# Patient Record
Sex: Male | Born: 1935
Health system: Southern US, Community
[De-identification: ages and names within clinical notes are randomized; demographics above are authoritative.]

## PROBLEM LIST (undated history)

## (undated) DIAGNOSIS — C801 Malignant (primary) neoplasm, unspecified: Secondary | ICD-10-CM

## (undated) DIAGNOSIS — M19011 Primary osteoarthritis, right shoulder: Secondary | ICD-10-CM

## (undated) DIAGNOSIS — I519 Heart disease, unspecified: Secondary | ICD-10-CM

## (undated) DIAGNOSIS — I10 Essential (primary) hypertension: Secondary | ICD-10-CM

## (undated) DIAGNOSIS — Z8546 Personal history of malignant neoplasm of prostate: Secondary | ICD-10-CM

## (undated) DIAGNOSIS — I2510 Atherosclerotic heart disease of native coronary artery without angina pectoris: Secondary | ICD-10-CM

## (undated) DIAGNOSIS — T4145XA Adverse effect of unspecified anesthetic, initial encounter: Secondary | ICD-10-CM

## (undated) DIAGNOSIS — T8859XA Other complications of anesthesia, initial encounter: Secondary | ICD-10-CM

## (undated) DIAGNOSIS — E785 Hyperlipidemia, unspecified: Secondary | ICD-10-CM

## (undated) DIAGNOSIS — Z973 Presence of spectacles and contact lenses: Secondary | ICD-10-CM

## (undated) DIAGNOSIS — M1990 Unspecified osteoarthritis, unspecified site: Secondary | ICD-10-CM

## (undated) HISTORY — PX: TONSILLECTOMY: SUR1361

## (undated) HISTORY — PX: CATARACT EXTRACTION, BILATERAL: SHX1313

## (undated) HISTORY — DX: Heart disease, unspecified: I51.9

## (undated) HISTORY — DX: Essential (primary) hypertension: I10

## (undated) HISTORY — DX: Hyperlipidemia, unspecified: E78.5

## (undated) HISTORY — DX: Personal history of malignant neoplasm of prostate: Z85.46

## (undated) HISTORY — PX: COLONOSCOPY: SHX174

## (undated) HISTORY — PX: CARDIAC CATHETERIZATION: SHX172

## (undated) HISTORY — PX: INSERTION PROSTATE RADIATION SEED: SUR718

---

## 1998-09-27 ENCOUNTER — Encounter (INDEPENDENT_AMBULATORY_CARE_PROVIDER_SITE_OTHER): Payer: Self-pay

## 1998-09-27 ENCOUNTER — Ambulatory Visit (HOSPITAL_COMMUNITY): Admission: RE | Admit: 1998-09-27 | Discharge: 1998-09-27 | Payer: Self-pay | Admitting: Gastroenterology

## 2001-03-21 ENCOUNTER — Ambulatory Visit: Admission: RE | Admit: 2001-03-21 | Discharge: 2001-06-19 | Payer: Self-pay | Admitting: Radiation Oncology

## 2001-03-24 ENCOUNTER — Encounter: Payer: Self-pay | Admitting: Urology

## 2001-03-24 ENCOUNTER — Ambulatory Visit (HOSPITAL_COMMUNITY): Admission: RE | Admit: 2001-03-24 | Discharge: 2001-03-24 | Payer: Self-pay | Admitting: Urology

## 2001-03-25 ENCOUNTER — Ambulatory Visit (HOSPITAL_COMMUNITY): Admission: RE | Admit: 2001-03-25 | Discharge: 2001-03-25 | Payer: Self-pay | Admitting: Urology

## 2001-03-25 ENCOUNTER — Encounter: Payer: Self-pay | Admitting: Urology

## 2001-06-20 ENCOUNTER — Ambulatory Visit: Admission: RE | Admit: 2001-06-20 | Discharge: 2001-09-18 | Payer: Self-pay | Admitting: Radiation Oncology

## 2001-07-18 ENCOUNTER — Encounter: Payer: Self-pay | Admitting: Urology

## 2001-07-18 ENCOUNTER — Encounter: Admission: RE | Admit: 2001-07-18 | Discharge: 2001-07-18 | Payer: Self-pay | Admitting: Urology

## 2001-07-18 ENCOUNTER — Ambulatory Visit (HOSPITAL_COMMUNITY): Admission: RE | Admit: 2001-07-18 | Discharge: 2001-07-18 | Payer: Self-pay | Admitting: Urology

## 2001-08-12 ENCOUNTER — Ambulatory Visit (HOSPITAL_COMMUNITY): Admission: RE | Admit: 2001-08-12 | Discharge: 2001-08-12 | Payer: Self-pay | Admitting: Urology

## 2001-08-12 ENCOUNTER — Encounter: Payer: Self-pay | Admitting: Urology

## 2005-04-26 ENCOUNTER — Encounter: Admission: RE | Admit: 2005-04-26 | Discharge: 2005-04-26 | Payer: Self-pay | Admitting: Internal Medicine

## 2005-05-01 ENCOUNTER — Encounter: Admission: RE | Admit: 2005-05-01 | Discharge: 2005-05-01 | Payer: Self-pay | Admitting: Internal Medicine

## 2008-10-13 ENCOUNTER — Inpatient Hospital Stay (HOSPITAL_COMMUNITY): Admission: EM | Admit: 2008-10-13 | Discharge: 2008-10-14 | Payer: Self-pay | Admitting: Emergency Medicine

## 2008-10-14 ENCOUNTER — Encounter (INDEPENDENT_AMBULATORY_CARE_PROVIDER_SITE_OTHER): Payer: Self-pay | Admitting: *Deleted

## 2008-10-14 ENCOUNTER — Ambulatory Visit: Payer: Self-pay | Admitting: Vascular Surgery

## 2008-11-01 ENCOUNTER — Encounter: Admission: RE | Admit: 2008-11-01 | Discharge: 2008-11-01 | Payer: Self-pay | Admitting: Internal Medicine

## 2009-06-05 DIAGNOSIS — Z8546 Personal history of malignant neoplasm of prostate: Secondary | ICD-10-CM

## 2009-06-05 HISTORY — PX: CORONARY STENT PLACEMENT: SHX1402

## 2009-06-05 HISTORY — DX: Personal history of malignant neoplasm of prostate: Z85.46

## 2009-06-10 ENCOUNTER — Inpatient Hospital Stay (HOSPITAL_COMMUNITY)
Admission: RE | Admit: 2009-06-10 | Discharge: 2009-06-11 | Payer: Self-pay | Source: Home / Self Care | Admitting: Cardiology

## 2009-06-23 ENCOUNTER — Encounter (HOSPITAL_COMMUNITY): Admission: RE | Admit: 2009-06-23 | Discharge: 2009-09-21 | Payer: Self-pay | Admitting: Cardiology

## 2009-07-01 ENCOUNTER — Emergency Department (HOSPITAL_COMMUNITY): Admission: EM | Admit: 2009-07-01 | Discharge: 2009-07-01 | Payer: Self-pay | Admitting: Emergency Medicine

## 2009-08-01 DIAGNOSIS — K4090 Unilateral inguinal hernia, without obstruction or gangrene, not specified as recurrent: Secondary | ICD-10-CM | POA: Insufficient documentation

## 2009-09-22 ENCOUNTER — Encounter (HOSPITAL_COMMUNITY): Admission: RE | Admit: 2009-09-22 | Discharge: 2009-11-04 | Payer: Self-pay | Admitting: Cardiology

## 2010-04-24 LAB — URINE CULTURE: Culture: NO GROWTH

## 2010-04-24 LAB — POCT I-STAT, CHEM 8
BUN: 22 mg/dL (ref 6–23)
Chloride: 109 mEq/L (ref 96–112)
Creatinine, Ser: 1 mg/dL (ref 0.4–1.5)
Glucose, Bld: 97 mg/dL (ref 70–99)
Potassium: 3.9 mEq/L (ref 3.5–5.1)
Sodium: 139 mEq/L (ref 135–145)
TCO2: 23 mmol/L (ref 0–100)

## 2010-04-24 LAB — CBC
HCT: 46.2 % (ref 39.0–52.0)
Hemoglobin: 16.3 g/dL (ref 13.0–17.0)
RDW: 13.3 % (ref 11.5–15.5)

## 2010-04-24 LAB — PROTIME-INR: INR: 1.1 (ref 0.00–1.49)

## 2010-04-24 LAB — DIFFERENTIAL
Basophils Absolute: 0 10*3/uL (ref 0.0–0.1)
Eosinophils Absolute: 0.1 10*3/uL (ref 0.0–0.7)
Eosinophils Relative: 2 % (ref 0–5)
Lymphocytes Relative: 20 % (ref 12–46)
Monocytes Absolute: 0.6 10*3/uL (ref 0.1–1.0)
Neutrophils Relative %: 68 % (ref 43–77)

## 2010-04-24 LAB — URINALYSIS, ROUTINE W REFLEX MICROSCOPIC
Ketones, ur: NEGATIVE mg/dL
Specific Gravity, Urine: 1.025 (ref 1.005–1.030)
Urobilinogen, UA: 1 mg/dL (ref 0.0–1.0)
pH: 5.5 (ref 5.0–8.0)

## 2010-04-24 LAB — POCT CARDIAC MARKERS
CKMB, poc: 2 ng/mL (ref 1.0–8.0)
Troponin i, poc: <0.05 ng/mL (ref 0.00–0.09)

## 2010-04-25 LAB — LIPID PANEL
Total CHOL/HDL Ratio: 3.1 RATIO
Triglycerides: 103 mg/dL (ref <150)
VLDL: 21 mg/dL (ref 0–40)

## 2010-04-25 LAB — BASIC METABOLIC PANEL
BUN: 13 mg/dL (ref 6–23)
CO2: 24 mEq/L (ref 19–32)
Calcium: 8.9 mg/dL (ref 8.4–10.5)
Chloride: 109 mEq/L (ref 96–112)
Creatinine, Ser: 0.85 mg/dL (ref 0.4–1.5)
GFR calc non Af Amer: >60 mL/min (ref >60)
Glucose, Bld: 91 mg/dL (ref 70–99)
Sodium: 140 mEq/L (ref 135–145)

## 2010-04-25 LAB — CBC
HCT: 41.7 % (ref 39.0–52.0)
RBC: 4.67 MIL/uL (ref 4.22–5.81)
RDW: 13.5 % (ref 11.5–15.5)
WBC: 5.8 10*3/uL (ref 4.0–10.5)

## 2010-05-12 LAB — COMPREHENSIVE METABOLIC PANEL
AST: 29 U/L (ref 0–37)
Albumin: 4.3 g/dL (ref 3.5–5.2)
BUN: 13 mg/dL (ref 6–23)
BUN: 20 mg/dL (ref 6–23)
CO2: 22 mEq/L (ref 19–32)
CO2: 24 mEq/L (ref 19–32)
Calcium: 8.5 mg/dL (ref 8.4–10.5)
Calcium: 9.9 mg/dL (ref 8.4–10.5)
Chloride: 108 mEq/L (ref 96–112)
Creatinine, Ser: 0.9 mg/dL (ref 0.4–1.5)
Creatinine, Ser: 1.32 mg/dL (ref 0.4–1.5)
GFR calc Af Amer: >60 mL/min (ref >60)
GFR calc non Af Amer: 53 mL/min — ABNORMAL LOW (ref >60)
GFR calc non Af Amer: >60 mL/min (ref >60)
Glucose, Bld: 120 mg/dL — ABNORMAL HIGH (ref 70–99)
Glucose, Bld: 125 mg/dL — ABNORMAL HIGH (ref 70–99)
Sodium: 143 mEq/L (ref 135–145)
Total Protein: 5.3 g/dL — ABNORMAL LOW (ref 6.0–8.3)

## 2010-05-12 LAB — DIFFERENTIAL
Lymphs Abs: 0.6 10*3/uL — ABNORMAL LOW (ref 0.7–4.0)
Neutro Abs: 13.6 10*3/uL — ABNORMAL HIGH (ref 1.7–7.7)

## 2010-05-12 LAB — CARDIAC PANEL(CRET KIN+CKTOT+MB+TROPI)
CK, MB: 9.8 ng/mL — ABNORMAL HIGH (ref 0.3–4.0)
Relative Index: INVALID (ref 0.0–2.5)
Relative Index: INVALID (ref 0.0–2.5)
Total CK: 71 U/L (ref 7–232)

## 2010-05-12 LAB — LIPID PANEL
Cholesterol: 146 mg/dL (ref 0–200)
HDL: 50 mg/dL (ref >39)
LDL Cholesterol: 76 mg/dL (ref 0–99)
Triglycerides: 100 mg/dL (ref <150)

## 2010-05-12 LAB — URINALYSIS, ROUTINE W REFLEX MICROSCOPIC
Glucose, UA: NEGATIVE mg/dL
Hgb urine dipstick: NEGATIVE
Ketones, ur: 15 mg/dL — AB
Specific Gravity, Urine: 1.026 (ref 1.005–1.030)

## 2010-05-12 LAB — CULTURE, BLOOD (ROUTINE X 2)
Culture: NO GROWTH
Culture: NO GROWTH

## 2010-05-12 LAB — URINE CULTURE: Colony Count: 7000

## 2010-05-12 LAB — CK TOTAL AND CKMB (NOT AT ARMC): Total CK: 181 U/L (ref 7–232)

## 2010-05-12 LAB — POCT CARDIAC MARKERS: Myoglobin, poc: >500 ng/mL (ref 12–200)

## 2010-05-12 LAB — URINE MICROSCOPIC-ADD ON

## 2010-05-12 LAB — TSH: TSH: 0.614 u[IU]/mL (ref 0.350–4.500)

## 2010-05-12 LAB — CBC
HCT: 50.7 % (ref 39.0–52.0)
Hemoglobin: 14.8 g/dL (ref 13.0–17.0)
Hemoglobin: 17.6 g/dL — ABNORMAL HIGH (ref 13.0–17.0)
MCHC: 34.7 g/dL (ref 30.0–36.0)
MCV: 88.9 fL (ref 78.0–100.0)
Platelets: 178 10*3/uL (ref 150–400)
RBC: 4.8 MIL/uL (ref 4.22–5.81)
RDW: 14 % (ref 11.5–15.5)

## 2010-05-12 LAB — PROTIME-INR: INR: 1.1 (ref 0.00–1.49)

## 2010-05-12 LAB — TROPONIN I: Troponin I: 0.01 ng/mL (ref 0.00–0.06)

## 2010-06-23 NOTE — Op Note (Signed)
[facility]  Patient:    Gary Armstrong, Gary Armstrong Visit Number: [id number] MRN: [id number]          Service Type: DSU Location: DAY Attending Physician:  Ellwood Handler Dictated by:   Verl Dicker, M.D. Proc. Date: 08/12/01 Admit Date:  08/12/2001 Discharge Date: 08/12/2001   CC:         Theressa Millard, M.D.  Wynn Banker, M.D.   Operative Report  DATE OF BIRTH:  06/22/1929  LMD:  Theressa Millard, M.D.  RADIATION ONCOLOGIST:  Wynn Banker, M.D.  UROLOGIST:  Verl Dicker, M.D.  PREOPERATIVE DIAGNOSIS:  Prostate cancer.  POSTOPERATIVE DIAGNOSIS:  Prostate cancer.  PROCEDURE:  Transperineal placement of radioactive palladium 103 seeds.  DRAINS:  24 French Foley.  ANESTHESIA:  General.  DESCRIPTION OF PROCEDURE:  The patient was prepped and draped in the dorsal lithotomy position after institution of an adequate level of general anesthesia. A suprapubic fluoroscopy unit and transrectal ultrasound unit were placed as well as a rectal tube. The indwelling Foley catheter was then placed. A transverse and longitudinal scan of the prostate confirmed those obtained during the planning phase. Anchor needles were placed at C3.0, d3.0 and then a total of 75 seeds of palladium 103 were placed according to prearranged coordinates. A total of 18 needles were placed. Adjustment was made on needle #12 from d2.5 to E2.5, all other needles were placed according to prearranged coordinates and appeared to be in good position by ultrasound and fluoroscopy criteria. The fluoroscopy unit was then withdrawn as was the transrectal ultrasound and the indwelling Foley. Fiberoptic cystoscopy showed a normal urethra and sphincter, nonobstructive prostate, clear efflux right and left ureteral orifice, no evidence of seeds within the bladder. An 47 French Foley was then inserted and left to straight drain and the patient was returned to  recovery in satisfactory condition. Dictated by:   Verl Dicker, M.D. Attending Physician:  Ellwood Handler DD:  08/12/01 TD:  08/15/01 Job: [id number]

## 2010-10-31 ENCOUNTER — Encounter (INDEPENDENT_AMBULATORY_CARE_PROVIDER_SITE_OTHER): Payer: Self-pay | Admitting: Surgery

## 2010-11-02 ENCOUNTER — Encounter (INDEPENDENT_AMBULATORY_CARE_PROVIDER_SITE_OTHER): Payer: Self-pay | Admitting: Surgery

## 2010-11-02 ENCOUNTER — Encounter (INDEPENDENT_AMBULATORY_CARE_PROVIDER_SITE_OTHER): Payer: Self-pay | Admitting: General Surgery

## 2010-11-02 ENCOUNTER — Ambulatory Visit (INDEPENDENT_AMBULATORY_CARE_PROVIDER_SITE_OTHER): Payer: Medicare Other | Admitting: Surgery

## 2010-11-02 DIAGNOSIS — K4090 Unilateral inguinal hernia, without obstruction or gangrene, not specified as recurrent: Secondary | ICD-10-CM

## 2010-11-02 NOTE — Patient Instructions (Signed)
We will make arrangements for your hernia repair shortly after Thanksgiving by Dr Johna Sheriff.  We will need a note from your cardiolgist that you are ok for surgery and we will need to see you in the office about a week or two before the surgery.

## 2010-11-02 NOTE — Progress Notes (Signed)
  CC: Inguinal hernia HPI: I saw this patient last year with a right inguinal hernia. This was shortly after he had had a cardiac stent placed. He was asymptomatic and his cardiologist wanted [place] to wait about a year before doing surgery if possible. He comes in now to discuss possible repair. He has not developed any more symptoms and he had when I saw him last year.    PE GENERAL:  The patient is alert, oriented, and generally healthy-appearing, NAD. Mood and affect are normal.  HEENT:  The head is normocephalic, the eyes nonicteric, the pupils were round regular and equal. EOMs are normal. Pharynx normal. Dentition good.  NECK:  The neck is supple and there are no masses or thyromegaly.  LUNGS:  Normal respirations and clear to auscultation.  HEART:  Regular rhythm, with no murmurs rubs or gallops. Pulses are intact carotid dorsalis pedis and posterior tibial. No significant varicosities are noted.  ABDOMEN:  Soft, flat, and nontender. No masses or organomegaly is noted. No hernias are noted. Bowel sounds are normal.  GU:  Reducible Right inguinal hernia extending down towards the scrotum  EXTREMITIES:  Good range of motion, no edema.   Data Reviewed I have reviewed the notes from his primary care office.  Assessment Moderate sized reducible right inguinal hernia probably indirect  Plan I think it is appropriate to go ahead and schedule him for surgery. He would like to be done right after Thanksgiving. He would like to be done laparoscopically and I told him that I would arrange for one of my partners he does laparoscopic surgery to do the surgery. I told him we would need to have a cardiac clearance preoperatively in the yard he has a cardiology appointment scheduled in two weeks. He would also need to be seen in the office preoperatively for further discussions about the surgery.

## 2011-01-04 ENCOUNTER — Ambulatory Visit (INDEPENDENT_AMBULATORY_CARE_PROVIDER_SITE_OTHER): Payer: Medicare Other | Admitting: General Surgery

## 2011-01-04 ENCOUNTER — Encounter (INDEPENDENT_AMBULATORY_CARE_PROVIDER_SITE_OTHER): Payer: Self-pay | Admitting: General Surgery

## 2011-01-04 VITALS — BP 146/92 | HR 74 | Temp 96.4°F | Resp 16 | Ht 69.0 in | Wt 163.6 lb

## 2011-01-04 DIAGNOSIS — K4090 Unilateral inguinal hernia, without obstruction or gangrene, not specified as recurrent: Secondary | ICD-10-CM | POA: Insufficient documentation

## 2011-01-04 NOTE — Progress Notes (Signed)
Subjective:   Right inguinal hernia  Patient ID: Gary Armstrong, male   DOB: April 11, 1929, 77 y.o.   MRN: [id number]  HPI Patient returns for a preop evaluation prior to planned right inguinal hernia repair. He has had a gradually enlarging bulge in his right groin for at least a couple of years. Repair was temporarily delayed after cardiac stenting. It is gradually getting bigger. He occasionally gets some crampy mid abdominal pain and hernia gets very hard and then it improves after he lies down. He has had a colonoscopy within the last couple of years. No nausea or vomiting. He's not had any previous abdominal or hernia surgery. He notices no symptoms on the left.  Past Medical History  Diagnosis Date  . Hypertension   . Heart disease   . Hyperlipidemia   . History of prostate cancer 06/2009   Past Surgical History  Procedure Date  . Insertion prostate radiation seed   . Coronary stent placement 06/2009   Current Outpatient Prescriptions  Medication Sig Dispense Refill  . Ascorbic Acid (VITAMIN C) 1000 MG tablet Take 1,000 mg by mouth daily.        Marland Kitchen aspirin 325 MG tablet Take 325 mg by mouth daily.        . cholecalciferol (VITAMIN D) 400 UNITS TABS Take by mouth.        . fish oil-omega-3 fatty acids 1000 MG capsule Take 2 g by mouth daily.        Marland Kitchen glucosamine-chondroitin 500-400 MG tablet Take 1 tablet by mouth 3 (three) times daily.        . lansoprazole (PREVACID) 15 MG capsule Take 15 mg by mouth daily.        Marland Kitchen lisinopril (PRINIVIL,ZESTRIL) 5 MG tablet Take 5 mg by mouth daily.        Marland Kitchen lycopene 6 MG capsule Take 6 mg by mouth daily.        . nitroGLYCERIN (NITROSTAT) 0.4 MG SL tablet Place 0.4 mg under the tongue every 5 (five) minutes as needed.        . Potassium 99 MG TABS Take 99 mg by mouth daily.        . prasugrel (EFFIENT) 10 MG TABS Take 10 mg by mouth daily.        . simvastatin (ZOCOR) 40 MG tablet Take 40 mg by mouth at bedtime.        . Tamsulosin HCl (FLOMAX) 0.4  MG CAPS Take 0.4 mg by mouth daily.        . vitamin E 400 UNIT capsule Take 400 Units by mouth daily.        Marland Kitchen zinc gluconate 50 MG tablet Take 50 mg by mouth daily.         No Known Allergies   Review of Systems  Constitutional: Negative for fever, diaphoresis and appetite change.  HENT: Negative.   Respiratory: Negative for cough and shortness of breath.   Cardiovascular: Negative for chest pain.  Gastrointestinal: Positive for abdominal pain. Negative for diarrhea and constipation.       Objective:   Physical Exam General: Alert, well-developed thin white male, in no distress Skin: Warm and dry without rash or infection. HEENT: No palpable masses or thyromegaly. Sclera nonicteric. Pupils equal round and reactive. Oropharynx clear. Lymph nodes: No cervical, supraclavicular, or inguinal nodes palpable. Lungs: Breath sounds clear and equal without increased work of breathing Cardiovascular: Regular rate and rhythm without murmur. No JVD or edema. Peripheral pulses  intact. Abdomen: Nondistended. Soft and nontender. No masses palpable. No organomegaly. There is a good sized right inguinal hernia extending down into the upper scrotum that is completely reducible with the patient lying flat. No hernia palpable on the left.. Extremities: No edema or joint swelling or deformity. No chronic venous stasis changes. Neurologic: Alert and fully oriented. Gait normal.    Assessment:     Enlarging symptomatic right inguinal hernia. This certainly needs to be repaired. We discussed surgical options in detail including open and laparoscopic repair and he prefers laparoscopic repair so he can return to physical activity more quickly. We discussed the nature of the procedure in detail, expected recovery, and risks of general anesthesia with respiratory or cardiac problems, bleeding, infection, recurrence, chronic pain syndromes, and rare risk of bowel or bladder injury. He was given complete literature  regarding the procedure and his wife's questions were answered. This is scheduled for later next week.    Plan:     Laparoscopic repair of right inguinal hernia under general anesthesia as an outpatient

## 2011-01-04 NOTE — Patient Instructions (Signed)
Call for questions or concerns prior to her surgery

## 2011-01-10 ENCOUNTER — Telehealth (INDEPENDENT_AMBULATORY_CARE_PROVIDER_SITE_OTHER): Payer: Self-pay | Admitting: General Surgery

## 2011-01-10 DIAGNOSIS — K4090 Unilateral inguinal hernia, without obstruction or gangrene, not specified as recurrent: Secondary | ICD-10-CM

## 2011-01-10 HISTORY — PX: HERNIA REPAIR: SHX51

## 2011-01-25 ENCOUNTER — Encounter (INDEPENDENT_AMBULATORY_CARE_PROVIDER_SITE_OTHER): Payer: Self-pay | Admitting: General Surgery

## 2011-01-25 ENCOUNTER — Ambulatory Visit (INDEPENDENT_AMBULATORY_CARE_PROVIDER_SITE_OTHER): Payer: Medicare Other | Admitting: General Surgery

## 2011-01-25 VITALS — BP 152/96 | HR 82 | Temp 97.5°F | Ht 68.0 in | Wt 160.6 lb

## 2011-01-25 DIAGNOSIS — Z9889 Other specified postprocedural states: Secondary | ICD-10-CM

## 2011-01-25 NOTE — Progress Notes (Signed)
Patient returns following his laparoscopic repair of large right inguinal hernia. He reports he has got a long extremely well, denying any pain or difficulty postoperatively. He is walking regularly.  On examination the repair feels solid and his wounds are well healed.  He is done very well following laparoscopic hernia repair. We discussed return to normal physical activity and exercise. He will return here as needed.

## 2011-02-09 ENCOUNTER — Encounter (INDEPENDENT_AMBULATORY_CARE_PROVIDER_SITE_OTHER): Payer: Medicare Other | Admitting: General Surgery

## 2011-03-19 ENCOUNTER — Encounter (HOSPITAL_COMMUNITY): Payer: Self-pay

## 2011-03-19 ENCOUNTER — Other Ambulatory Visit: Payer: Self-pay

## 2011-03-19 ENCOUNTER — Emergency Department (HOSPITAL_COMMUNITY)
Admission: EM | Admit: 2011-03-19 | Discharge: 2011-03-19 | Disposition: A | Discharge status: Home / Self Care | Payer: Medicare Other | Attending: Emergency Medicine | Admitting: Emergency Medicine

## 2011-03-19 ENCOUNTER — Emergency Department (HOSPITAL_COMMUNITY): Payer: Medicare Other

## 2011-03-19 DIAGNOSIS — R059 Cough, unspecified: Secondary | ICD-10-CM | POA: Insufficient documentation

## 2011-03-19 DIAGNOSIS — R55 Syncope and collapse: Secondary | ICD-10-CM

## 2011-03-19 DIAGNOSIS — R42 Dizziness and giddiness: Secondary | ICD-10-CM | POA: Diagnosis not present

## 2011-03-19 DIAGNOSIS — I517 Cardiomegaly: Secondary | ICD-10-CM | POA: Diagnosis not present

## 2011-03-19 DIAGNOSIS — I10 Essential (primary) hypertension: Secondary | ICD-10-CM | POA: Diagnosis not present

## 2011-03-19 DIAGNOSIS — J40 Bronchitis, not specified as acute or chronic: Secondary | ICD-10-CM | POA: Diagnosis not present

## 2011-03-19 DIAGNOSIS — R404 Transient alteration of awareness: Secondary | ICD-10-CM | POA: Diagnosis not present

## 2011-03-19 DIAGNOSIS — E785 Hyperlipidemia, unspecified: Secondary | ICD-10-CM | POA: Diagnosis not present

## 2011-03-19 DIAGNOSIS — J209 Acute bronchitis, unspecified: Secondary | ICD-10-CM | POA: Diagnosis not present

## 2011-03-19 DIAGNOSIS — R05 Cough: Secondary | ICD-10-CM | POA: Insufficient documentation

## 2011-03-19 DIAGNOSIS — R61 Generalized hyperhidrosis: Secondary | ICD-10-CM | POA: Diagnosis not present

## 2011-03-19 LAB — DIFFERENTIAL
Basophils Absolute: 0 10*3/uL (ref 0.0–0.1)
Basophils Relative: 0 % (ref 0–1)
Eosinophils Absolute: 0.1 10*3/uL (ref 0.0–0.7)
Eosinophils Relative: 2 % (ref 0–5)
Monocytes Absolute: 0.5 10*3/uL (ref 0.1–1.0)

## 2011-03-19 LAB — CBC
HCT: 41 % (ref 39.0–52.0)
MCH: 30.7 pg (ref 26.0–34.0)
MCHC: 36.3 g/dL — ABNORMAL HIGH (ref 30.0–36.0)
MCV: 84.4 fL (ref 78.0–100.0)
RDW: 13.1 % (ref 11.5–15.5)

## 2011-03-19 LAB — BASIC METABOLIC PANEL
CO2: 25 mEq/L (ref 19–32)
Calcium: 9.3 mg/dL (ref 8.4–10.5)
Creatinine, Ser: 0.86 mg/dL (ref 0.50–1.35)
GFR calc Af Amer: >90 mL/min (ref >90)
GFR calc non Af Amer: 80 mL/min — ABNORMAL LOW (ref >90)

## 2011-03-19 LAB — TROPONIN I: Troponin I: <0.3 ng/mL (ref <0.30)

## 2011-03-19 MED ORDER — ALBUTEROL SULFATE HFA 108 (90 BASE) MCG/ACT IN AERS: 2.0000 | INHALATION_SPRAY | RESPIRATORY_TRACT | Status: DC | PRN

## 2011-03-19 MED ORDER — ACETAMINOPHEN-CODEINE 120-12 MG/5ML PO SOLN: 10.0000 mL | Freq: Four times a day (QID) | ORAL | Status: AC | PRN

## 2011-03-19 MED ORDER — ALBUTEROL SULFATE (5 MG/ML) 0.5% IN NEBU
2.5000 mg | INHALATION_SOLUTION | Freq: Once | RESPIRATORY_TRACT | Status: AC
  Administered 2011-03-19: 2.5 mg via RESPIRATORY_TRACT
  Filled 2011-03-19 (×2): qty 0.5

## 2011-03-19 NOTE — ED Notes (Signed)
Pt undressed and in a gown. Cardiac monitor, blood pressure cuff, and pulse ox on. Ekg done.

## 2011-03-19 NOTE — ED Notes (Signed)
Pt to ED via [facility], pt stood up this am, shaving, began coughing become light headed set down on bench seat and slid into floor, per spouse pt had brief period of LOC, pt reports not feeling well x2 days, 20 g RN, 12 lead NSR bp on arrival 90/60 just PTA 123/75, pt received 100 cc NS en route

## 2011-03-19 NOTE — ED Provider Notes (Signed)
History     CSN: [id number]  Arrival date & time 03/19/11  0810   First MD Initiated Contact with Patient 03/19/11 [phone]      Chief Complaint  Patient presents with  . Loss of Consciousness    (Consider location/radiation/quality/duration/timing/severity/associated sxs/prior treatment) Patient is a 78 y.o. male presenting with syncope. The history is provided by the patient and the spouse.  Loss of Consciousness  He is had a nonproductive cough for the last 3 days and has been taking Tussionex for back. 2 shave and started to feel lightheaded. He sat down at which point his wife noticed that he was looking like he was going to pass out and he then did indeed pass out. Loss of consciousness was rather brief, estimated at less than 1 minute. There was no chest pain, heaviness, tightness, pressure. He has not been having any dyspnea. He has not been having any fever or chills. He did not have any nausea or diaphoresis. He has had similar episodes in the past when he has had respiratory illnesses. For about 10 minutes before this episode occurred. He was not actively coughing when this episode occurred.  Past Medical History  Diagnosis Date  . Hypertension   . Heart disease   . Hyperlipidemia   . History of prostate cancer 06/2009    Past Surgical History  Procedure Date  . Insertion prostate radiation seed   . Coronary stent placement 06/2009  . Hernia repair 01/10/11    rih    Family History  Problem Relation Age of Onset  . Emphysema Mother     History  Substance Use Topics  . Smoking status: Never Smoker   . Smokeless tobacco: Never Used  . Alcohol Use: Yes     very little      Review of Systems  Cardiovascular: Positive for syncope.  All other systems reviewed and are negative.    Allergies  Review of patient's allergies indicates no known allergies.  Home Medications   Current Outpatient Rx  Name Route Sig Dispense Refill  . VITAMIN C 1000 MG PO TABS Oral  Take 1,000 mg by mouth daily.     . ASPIRIN 325 MG PO TABS Oral Take 325 mg by mouth daily.     . CHOLECALCIFEROL 400 UNITS PO TABS Oral Take 400 Units by mouth daily.     . OMEGA-3 FATTY ACIDS 1000 MG PO CAPS Oral Take 1 g by mouth daily.     Marland Kitchen GLUCOSAMINE-CHONDROITIN 500-400 MG PO TABS Oral Take 1 tablet by mouth daily.     Marland Kitchen LANSOPRAZOLE 15 MG PO CPDR Oral Take 15 mg by mouth daily.     Marland Kitchen LISINOPRIL 5 MG PO TABS Oral Take 5 mg by mouth daily.     Marland Kitchen LYCOPENE 6 MG PO CAPS Oral Take 6 mg by mouth daily.     Marland Kitchen POTASSIUM 99 MG PO TABS Oral Take 99 mg by mouth daily.     Marland Kitchen PRASUGREL HCL 10 MG PO TABS Oral Take 10 mg by mouth daily.     Marland Kitchen SIMVASTATIN 40 MG PO TABS Oral Take 40 mg by mouth at bedtime.      . TAMSULOSIN HCL 0.4 MG PO CAPS Oral Take 0.4 mg by mouth daily.      Marland Kitchen VITAMIN E 400 UNITS PO CAPS Oral Take 400 Units by mouth daily.      Marland Kitchen ZINC GLUCONATE 50 MG PO TABS Oral Take 50 mg by mouth daily.      Marland Kitchen  NITROGLYCERIN 0.4 MG SL SUBL Sublingual Place 0.4 mg under the tongue every 5 (five) minutes as needed. Chest pain      BP 117/52  Pulse 85  Temp(Src) 97.9 F (36.6 C) (Oral)  Resp 20  SpO2 96%  Physical Exam  Nursing note and vitals reviewed.  84-year-old male who is resting comfortably and in no acute distress. Vital signs are normal. Oxygen saturation is 96% which is normal. Head is normocephalic and atraumatic. PERRLA, EOMI. Oropharynx is clear. Neck is nontender and supple without adenopathy, JVD, or bruit. Back is nontender. Lungs are clear without rales, wheezes, or rhonchi. Slight rhonchi are heard on forced exhalation. Heart has regular rate and rhythm without murmur. Abdomen is soft, flat, nontender without masses or hepatosplenomegaly. Extremities have no cyanosis or edema, full range of motion is present. Skin is warm and dry without rash. Neurologic: Mental status is normal, cranial nerves are intact, there no focal motor or sensory deficits. No abnormalities of mood or  affect.  ED Course  Procedures (including critical care time)  Results for orders placed during the hospital encounter of 03/19/11  CBC      Component Value Range   WBC 6.2  4.0 - 10.5 (K/uL)   RBC 4.86  4.22 - 5.81 (MIL/uL)   Hemoglobin 14.9  13.0 - 17.0 (g/dL)   HCT 16.1  09.6 - 04.5 (%)   MCV 84.4  78.0 - 100.0 (fL)   MCH 30.7  26.0 - 34.0 (pg)   MCHC 36.3 (*) 30.0 - 36.0 (g/dL)   RDW 40.9  81.1 - 91.4 (%)   Platelets 128 (*) 150 - 400 (K/uL)  DIFFERENTIAL      Component Value Range   Neutrophils Relative 83 (*) 43 - 77 (%)   Neutro Abs 5.1  1.7 - 7.7 (K/uL)   Lymphocytes Relative 7 (*) 12 - 46 (%)   Lymphs Abs 0.4 (*) 0.7 - 4.0 (K/uL)   Monocytes Relative 8  3 - 12 (%)   Monocytes Absolute 0.5  0.1 - 1.0 (K/uL)   Eosinophils Relative 2  0 - 5 (%)   Eosinophils Absolute 0.1  0.0 - 0.7 (K/uL)   Basophils Relative 0  0 - 1 (%)   Basophils Absolute 0.0  0.0 - 0.1 (K/uL)  BASIC METABOLIC PANEL      Component Value Range   Sodium 139  135 - 145 (mEq/L)   Potassium 4.0  3.5 - 5.1 (mEq/L)   Chloride 102  96 - 112 (mEq/L)   CO2 25  19 - 32 (mEq/L)   Glucose, Bld 94  70 - 99 (mg/dL)   BUN 16  6 - 23 (mg/dL)   Creatinine, Ser 7.82  0.50 - 1.35 (mg/dL)   Calcium 9.3  8.4 - 95.6 (mg/dL)   GFR calc non Af Amer 80 (*) >90 (mL/min)   GFR calc Af Amer >90  >90 (mL/min)  TROPONIN I      Component Value Range   Troponin I <0.30  <0.30 (ng/mL)   Dg Chest 2 View  03/19/2011  *RADIOLOGY REPORT*  Clinical Data: Loss of consciousness.  Hypertension.  CHEST - 2 VIEW  Comparison: Chest x-ray 07/01/2009.  Findings: Lung volumes are normal.  No consolidative airspace disease.  No pleural effusions.  Pulmonary vasculature is normal. Mild cardiomegaly.  Tortuosity of the thoracic aorta. Atherosclerosis.  IMPRESSION:  1.  No radiographic evidence of acute cardiopulmonary disease. 2.  Mild cardiomegaly. 3.  Atherosclerosis.  Original Report Authenticated  By: Florencia Reasons, M.D.      Date:  03/19/2011  Rate: 88  Rhythm: normal sinus rhythm and premature ventricular contractions (PVC)  QRS Axis: normal  Intervals: PR prolonged  ST/T Wave abnormalities: normal  Conduction Disutrbances:first-degree A-V block   Narrative Interpretation: First degree AV block with frequent PVCs in a trigeminal pattern. When compared with ECG of Jun 11 2009, no significant changes are noted with the exception of PVCs are now present.  Old EKG Reviewed: unchanged  Orthostatic vital signs are negative. His syncopal episodes could partly be related to medications and he is taking Tussionex. In any case, no evidence of serious pathology found today. He had partial relief of his cough with albuterol nebulizer treatment is given a prescription for an albuterol inhaler. Given a prescription for Tylenol with Codeine elixir of to try and use that for nocturnal cough control instead of Tussionex since the codeine would be less long-acting than the active ingredient in the Tussionex.  1. Syncope   2. Bronchitis       MDM  Syncope which is probably orthostatic. The fact that he has been on a nonnarcotic may have exacerbated this. The fact that it is a recurrent episode makes cardiac etiology less likely. According to his wife, all episodes occur when he has a respiratory illness with coughing.        Dione Booze, MD 03/19/11 [phone]

## 2011-03-19 NOTE — ED Notes (Signed)
Pt reports syncopal episode x1 this am w/brief LOC, witnessed by wife, spouse called 911. Pt also reports productive cough and nasal congestion for several days w/small amount of clear colored mucus, spouse reports pt's BP always decreases when he gets sick. Pt reports he became light headed while having a "coughing spell."

## 2011-03-19 NOTE — ED Notes (Signed)
MD at bedside. Dr. Glick. 

## 2011-03-19 NOTE — ED Notes (Signed)
Family at bedside. 

## 2011-03-19 NOTE — ED Notes (Signed)
Pt back from x-ray and placed back on monitor

## 2011-03-19 NOTE — ED Notes (Signed)
Patient denies pain and is resting comfortably.  

## 2011-03-19 NOTE — ED Notes (Signed)
Patient transported to X-ray 

## 2011-04-24 DIAGNOSIS — C61 Malignant neoplasm of prostate: Secondary | ICD-10-CM | POA: Diagnosis not present

## 2011-04-24 DIAGNOSIS — N529 Male erectile dysfunction, unspecified: Secondary | ICD-10-CM | POA: Diagnosis not present

## 2011-05-25 DIAGNOSIS — I2510 Atherosclerotic heart disease of native coronary artery without angina pectoris: Secondary | ICD-10-CM | POA: Diagnosis not present

## 2011-05-25 DIAGNOSIS — E7800 Pure hypercholesterolemia, unspecified: Secondary | ICD-10-CM | POA: Diagnosis not present

## 2011-05-25 DIAGNOSIS — I10 Essential (primary) hypertension: Secondary | ICD-10-CM | POA: Diagnosis not present

## 2011-07-05 DIAGNOSIS — Z0000 Encounter for general adult medical examination without abnormal findings: Secondary | ICD-10-CM | POA: Diagnosis not present

## 2011-07-05 DIAGNOSIS — I10 Essential (primary) hypertension: Secondary | ICD-10-CM | POA: Diagnosis not present

## 2011-09-04 ENCOUNTER — Other Ambulatory Visit: Payer: Self-pay | Admitting: Internal Medicine

## 2011-09-04 DIAGNOSIS — R279 Unspecified lack of coordination: Secondary | ICD-10-CM | POA: Diagnosis not present

## 2011-09-04 DIAGNOSIS — R270 Ataxia, unspecified: Secondary | ICD-10-CM

## 2011-09-04 DIAGNOSIS — R42 Dizziness and giddiness: Secondary | ICD-10-CM

## 2011-09-07 ENCOUNTER — Ambulatory Visit
Admission: RE | Admit: 2011-09-07 | Discharge: 2011-09-07 | Disposition: A | Discharge status: Home / Self Care | Payer: Medicare Other | Source: Ambulatory Visit | Attending: Internal Medicine | Admitting: Internal Medicine

## 2011-09-07 DIAGNOSIS — R279 Unspecified lack of coordination: Secondary | ICD-10-CM | POA: Diagnosis not present

## 2011-09-07 DIAGNOSIS — R270 Ataxia, unspecified: Secondary | ICD-10-CM

## 2011-09-07 DIAGNOSIS — R42 Dizziness and giddiness: Secondary | ICD-10-CM

## 2011-09-07 DIAGNOSIS — G9389 Other specified disorders of brain: Secondary | ICD-10-CM | POA: Diagnosis not present

## 2011-11-03 DIAGNOSIS — Z23 Encounter for immunization: Secondary | ICD-10-CM | POA: Diagnosis not present

## 2011-12-21 DIAGNOSIS — E7800 Pure hypercholesterolemia, unspecified: Secondary | ICD-10-CM | POA: Diagnosis not present

## 2011-12-21 DIAGNOSIS — I10 Essential (primary) hypertension: Secondary | ICD-10-CM | POA: Diagnosis not present

## 2012-05-14 DIAGNOSIS — E7800 Pure hypercholesterolemia, unspecified: Secondary | ICD-10-CM | POA: Diagnosis not present

## 2012-05-14 DIAGNOSIS — Z79899 Other long term (current) drug therapy: Secondary | ICD-10-CM | POA: Diagnosis not present

## 2012-05-21 DIAGNOSIS — I2510 Atherosclerotic heart disease of native coronary artery without angina pectoris: Secondary | ICD-10-CM | POA: Diagnosis not present

## 2012-05-21 DIAGNOSIS — I10 Essential (primary) hypertension: Secondary | ICD-10-CM | POA: Diagnosis not present

## 2012-05-21 DIAGNOSIS — E7800 Pure hypercholesterolemia, unspecified: Secondary | ICD-10-CM | POA: Diagnosis not present

## 2012-07-09 DIAGNOSIS — Z0000 Encounter for general adult medical examination without abnormal findings: Secondary | ICD-10-CM | POA: Diagnosis not present

## 2012-07-09 DIAGNOSIS — Z1331 Encounter for screening for depression: Secondary | ICD-10-CM | POA: Diagnosis not present

## 2012-07-09 DIAGNOSIS — I10 Essential (primary) hypertension: Secondary | ICD-10-CM | POA: Diagnosis not present

## 2012-07-23 DIAGNOSIS — C61 Malignant neoplasm of prostate: Secondary | ICD-10-CM | POA: Diagnosis not present

## 2012-07-23 DIAGNOSIS — N529 Male erectile dysfunction, unspecified: Secondary | ICD-10-CM | POA: Diagnosis not present

## 2012-11-16 DIAGNOSIS — Z23 Encounter for immunization: Secondary | ICD-10-CM | POA: Diagnosis not present

## 2013-01-21 DIAGNOSIS — I10 Essential (primary) hypertension: Secondary | ICD-10-CM | POA: Diagnosis not present

## 2013-03-04 ENCOUNTER — Other Ambulatory Visit: Payer: Self-pay | Admitting: Cardiology

## 2013-05-07 ENCOUNTER — Other Ambulatory Visit: Payer: Self-pay | Admitting: *Deleted

## 2013-05-07 DIAGNOSIS — Z79899 Other long term (current) drug therapy: Secondary | ICD-10-CM

## 2013-05-07 DIAGNOSIS — E7800 Pure hypercholesterolemia, unspecified: Secondary | ICD-10-CM

## 2013-05-14 ENCOUNTER — Other Ambulatory Visit (INDEPENDENT_AMBULATORY_CARE_PROVIDER_SITE_OTHER): Payer: Medicare Other

## 2013-05-14 DIAGNOSIS — Z79899 Other long term (current) drug therapy: Secondary | ICD-10-CM

## 2013-05-14 DIAGNOSIS — E7800 Pure hypercholesterolemia, unspecified: Secondary | ICD-10-CM

## 2013-05-14 LAB — HEPATIC FUNCTION PANEL
ALBUMIN: 3.8 g/dL (ref 3.5–5.2)
ALK PHOS: 69 U/L (ref 39–117)
ALT: 24 U/L (ref 0–53)
AST: 26 U/L (ref 0–37)
Bilirubin, Direct: 0.1 mg/dL (ref 0.0–0.3)
TOTAL PROTEIN: 6.3 g/dL (ref 6.0–8.3)
Total Bilirubin: 0.8 mg/dL (ref 0.3–1.2)

## 2013-05-14 LAB — LIPID PANEL
CHOL/HDL RATIO: 2
CHOLESTEROL: 162 mg/dL (ref 0–200)
HDL: 70 mg/dL (ref >39.00)
LDL CALC: 81 mg/dL (ref 0–99)
TRIGLYCERIDES: 57 mg/dL (ref 0.0–149.0)
VLDL: 11.4 mg/dL (ref 0.0–40.0)

## 2013-05-19 ENCOUNTER — Telehealth: Payer: Self-pay | Admitting: Cardiology

## 2013-05-19 NOTE — Telephone Encounter (Signed)
Spoke with patient advised of results per lab result note, labs where good, repeat in 1 year.

## 2013-05-19 NOTE — Telephone Encounter (Signed)
Follow up     Returning call to speak with nurse.

## 2013-05-19 NOTE — Telephone Encounter (Signed)
F/u   Please call pt at work.

## 2013-05-19 NOTE — Telephone Encounter (Signed)
Patient is returning your call. Please call back.  °

## 2013-05-21 ENCOUNTER — Ambulatory Visit: Payer: Medicare Other | Admitting: Cardiology

## 2013-06-04 ENCOUNTER — Ambulatory Visit (INDEPENDENT_AMBULATORY_CARE_PROVIDER_SITE_OTHER): Payer: Medicare Other | Admitting: Cardiology

## 2013-06-04 ENCOUNTER — Encounter: Payer: Self-pay | Admitting: Cardiology

## 2013-06-04 ENCOUNTER — Ambulatory Visit: Payer: Medicare Other | Admitting: Cardiology

## 2013-06-04 VITALS — BP 158/78 | HR 93 | Wt 158.0 lb

## 2013-06-04 DIAGNOSIS — I10 Essential (primary) hypertension: Secondary | ICD-10-CM | POA: Diagnosis not present

## 2013-06-04 DIAGNOSIS — E785 Hyperlipidemia, unspecified: Secondary | ICD-10-CM

## 2013-06-04 DIAGNOSIS — I2510 Atherosclerotic heart disease of native coronary artery without angina pectoris: Secondary | ICD-10-CM | POA: Insufficient documentation

## 2013-06-04 DIAGNOSIS — R011 Cardiac murmur, unspecified: Secondary | ICD-10-CM | POA: Insufficient documentation

## 2013-06-04 NOTE — Patient Instructions (Signed)
Your physician recommends that you continue on your current medications as directed. Please refer to the Current Medication list given to you today.  Your physician has requested that you have an echocardiogram. Echocardiography is a painless test that uses sound waves to create images of your heart. It provides your doctor with information about the size and shape of your heart and how well your heart's chambers and valves are working. This procedure takes approximately one hour. There are no restrictions for this procedure.  Your physician wants you to follow-up in: 1 year with Dr. Skains.  You will receive a reminder letter in the mail two months in advance. If you don't receive a letter, please call our office to schedule the follow-up appointment.  

## 2013-06-04 NOTE — Progress Notes (Signed)
[place]. [address], [address] [place], [place]  [address] Phone: [phone] Fax:  [phone]  Date:  06/04/2013   ID:  Gary Armstrong, DOB 1930-01-31, MRN [id number]  PCP:  Horton Finer, MD   History of Present Illness: Gary Armstrong is a 80 y.o. male with CAD, RCA stent 06/10/09, Promus dilated to 4.55mm with residual mid LAD disease of 70% here for follow up. Was on Effient.   He was seen in the emergency department after a syncopal episode occurred during cardiac rehabilitation. His lisinopril was stopped transiently but he is back on. It was felt to be neurocardiogenic or vasovagal.  He currently is doing very well, no limitations. No chest pain, no shortness of breath. we discussed that he may switch to low-dose aspirin. 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 He himself is still working daily.  His lipids have improved dramatically with an LDL reduction from 170s in 2007 now down to 70. I switched him in December of 2011 to Lipitor 40 mg once a day.      Wt Readings from Last 3 Encounters:  06/04/13 158 lb (71.668 kg)  01/25/11 160 lb 9.6 oz (72.848 kg)  01/04/11 163 lb 9.6 oz (74.208 kg)     Past Medical History  Diagnosis Date  . Hypertension   . Heart disease   . Hyperlipidemia   . History of prostate cancer 06/2009    Past Surgical History  Procedure Laterality Date  . Insertion prostate radiation seed    . Coronary stent placement  06/2009  . Hernia repair  01/10/11    rih    Current Outpatient Prescriptions  Medication Sig Dispense Refill  . albuterol (PROVENTIL HFA;VENTOLIN HFA) 108 (90 BASE) MCG/ACT inhaler Inhale 2 puffs into the lungs every 4 (four) hours as needed for wheezing or shortness of breath. Also prn for cough  1 Inhaler  0  . Ascorbic Acid (VITAMIN C) 1000 MG tablet Take 1,000 mg by mouth daily.       Marland Kitchen aspirin 325 MG tablet Take 325 mg by mouth daily.       Marland Kitchen atorvastatin (LIPITOR) 80 MG tablet  Take 1 tablet by mouth daily.      . cholecalciferol (VITAMIN D) 400 UNITS TABS Take 400 Units by mouth daily.       . fish oil-omega-3 fatty acids 1000 MG capsule Take 1 g by mouth daily.       Marland Kitchen glucosamine-chondroitin 500-400 MG tablet Take 1 tablet by mouth daily.       . lansoprazole (PREVACID) 15 MG capsule Take 15 mg by mouth daily.       Marland Kitchen lisinopril (PRINIVIL,ZESTRIL) 5 MG tablet Take 5 mg by mouth daily.       Marland Kitchen lycopene 6 MG capsule Take 6 mg by mouth daily.       Marland Kitchen NITROSTAT 0.4 MG SL tablet DISSOLVE ONE TABLET UNDER THE TONGUE AS NEEDED FOR CHEST PAIN AS DIRECTED  25 tablet  2  . Potassium 99 MG TABS Take 99 mg by mouth daily.       . prasugrel (EFFIENT) 10 MG TABS Take 10 mg by mouth daily.       . simvastatin (ZOCOR) 40 MG tablet Take 40 mg by mouth at bedtime.        . Tamsulosin HCl (FLOMAX) 0.4 MG CAPS Take 0.4 mg by mouth daily.        Marland Kitchen  vitamin E 400 UNIT capsule Take 400 Units by mouth daily.        Marland Kitchen zinc gluconate 50 MG tablet Take 50 mg by mouth daily.         No current facility-administered medications for this visit.    Allergies:   No Known Allergies  Social History:  The patient  reports that he has never smoked. He has never used smokeless tobacco. He reports that he drinks alcohol. He reports that he does not use illicit drugs.   Family History  Problem Relation Age of Onset  . Emphysema Mother     ROS:  Please see the history of present illness.   Denies any fevers, chills, orthopnea, PND   All other systems reviewed and negative.   PHYSICAL EXAM: VS:  BP 158/78  Pulse 93  Wt 158 lb (71.668 kg) Well nourished, well developed, in no acute distress HEENT: normal, [place]/AT, EOMI Neck: no JVD, normal carotid upstroke, no bruit Cardiac:  normal S1, S2; RRR; 1/6 S murmur Lungs:  clear to auscultation bilaterally, no wheezing, rhonchi or rales Abd: soft, nontender, no hepatomegaly, no bruits Ext: no edema, 2+ distal pulses Skin: warm and dry GU:  deferred Neuro: no focal abnormalities noted, AAO x 3  EKG:  06/04/13 Sinus rhythm, first degree AV block, heart rate 93, PR interval 258 ms, vertical axis     ASSESSMENT AND PLAN:  1. Heart murmur -has not had an echocardiogram previously. I will check. 2. CAD-no anginal symptoms. Doing very well. Aspirin only. 3. Hyperlipidemia-atorvastatin 40 mg. LDL 81. Overall good control. No changes made. Diet, exercise. 4. Hypertension-mildly elevated today but usually under good control. No changes made. 5. One year followup  Signed, Candee Furbish, MD [facility]  06/04/2013 2:40 PM

## 2013-06-19 ENCOUNTER — Ambulatory Visit (HOSPITAL_COMMUNITY): Payer: Medicare Other | Attending: Cardiology | Admitting: Radiology

## 2013-06-19 DIAGNOSIS — R011 Cardiac murmur, unspecified: Secondary | ICD-10-CM | POA: Diagnosis not present

## 2013-06-19 DIAGNOSIS — I2510 Atherosclerotic heart disease of native coronary artery without angina pectoris: Secondary | ICD-10-CM

## 2013-06-19 NOTE — Progress Notes (Signed)
Echocardiogram performed.  

## 2013-06-22 ENCOUNTER — Telehealth: Payer: Self-pay | Admitting: Cardiology

## 2013-06-22 NOTE — Telephone Encounter (Signed)
New message          Pt calling about echocardiogram results

## 2013-06-22 NOTE — Telephone Encounter (Signed)
LVM for patient to call the office

## 2013-06-23 NOTE — Telephone Encounter (Signed)
Follow up  ° ° ° °Returning call back to nurse  °

## 2013-06-24 ENCOUNTER — Telehealth: Payer: Self-pay | Admitting: Cardiology

## 2013-06-24 NOTE — Telephone Encounter (Signed)
I spoke with the pt and made him aware of ECHO results.

## 2013-06-24 NOTE — Telephone Encounter (Signed)
Patient has ECHO done and wants results. He is really upset that no one has called him back. He wants a call within the next 64mins. Please call and advise.

## 2013-06-25 NOTE — Telephone Encounter (Signed)
Patient is aware of results.

## 2013-07-13 DIAGNOSIS — Z0000 Encounter for general adult medical examination without abnormal findings: Secondary | ICD-10-CM | POA: Diagnosis not present

## 2013-07-13 DIAGNOSIS — E7800 Pure hypercholesterolemia, unspecified: Secondary | ICD-10-CM | POA: Diagnosis not present

## 2013-07-13 DIAGNOSIS — I10 Essential (primary) hypertension: Secondary | ICD-10-CM | POA: Diagnosis not present

## 2013-07-13 DIAGNOSIS — Z23 Encounter for immunization: Secondary | ICD-10-CM | POA: Diagnosis not present

## 2013-07-27 DIAGNOSIS — N529 Male erectile dysfunction, unspecified: Secondary | ICD-10-CM | POA: Diagnosis not present

## 2013-07-27 DIAGNOSIS — C61 Malignant neoplasm of prostate: Secondary | ICD-10-CM | POA: Diagnosis not present

## 2013-08-04 DIAGNOSIS — H2510 Age-related nuclear cataract, unspecified eye: Secondary | ICD-10-CM | POA: Diagnosis not present

## 2013-08-04 DIAGNOSIS — H524 Presbyopia: Secondary | ICD-10-CM | POA: Diagnosis not present

## 2013-11-06 DIAGNOSIS — Z23 Encounter for immunization: Secondary | ICD-10-CM | POA: Diagnosis not present

## 2013-11-18 ENCOUNTER — Other Ambulatory Visit: Payer: Self-pay | Admitting: Cardiology

## 2014-01-14 DIAGNOSIS — J069 Acute upper respiratory infection, unspecified: Secondary | ICD-10-CM | POA: Diagnosis not present

## 2014-01-14 DIAGNOSIS — I10 Essential (primary) hypertension: Secondary | ICD-10-CM | POA: Diagnosis not present

## 2014-01-14 DIAGNOSIS — E780 Pure hypercholesterolemia: Secondary | ICD-10-CM | POA: Diagnosis not present

## 2014-01-14 DIAGNOSIS — I2510 Atherosclerotic heart disease of native coronary artery without angina pectoris: Secondary | ICD-10-CM | POA: Diagnosis not present

## 2014-01-14 DIAGNOSIS — R011 Cardiac murmur, unspecified: Secondary | ICD-10-CM | POA: Diagnosis not present

## 2014-01-14 DIAGNOSIS — M189 Osteoarthritis of first carpometacarpal joint, unspecified: Secondary | ICD-10-CM | POA: Diagnosis not present

## 2014-01-14 DIAGNOSIS — K219 Gastro-esophageal reflux disease without esophagitis: Secondary | ICD-10-CM | POA: Diagnosis not present

## 2014-07-22 DIAGNOSIS — Z1389 Encounter for screening for other disorder: Secondary | ICD-10-CM | POA: Diagnosis not present

## 2014-07-22 DIAGNOSIS — C61 Malignant neoplasm of prostate: Secondary | ICD-10-CM | POA: Diagnosis not present

## 2014-07-22 DIAGNOSIS — Z0001 Encounter for general adult medical examination with abnormal findings: Secondary | ICD-10-CM | POA: Diagnosis not present

## 2014-07-22 DIAGNOSIS — E780 Pure hypercholesterolemia: Secondary | ICD-10-CM | POA: Diagnosis not present

## 2014-07-22 DIAGNOSIS — M189 Osteoarthritis of first carpometacarpal joint, unspecified: Secondary | ICD-10-CM | POA: Diagnosis not present

## 2014-07-22 DIAGNOSIS — I2510 Atherosclerotic heart disease of native coronary artery without angina pectoris: Secondary | ICD-10-CM | POA: Diagnosis not present

## 2014-07-22 DIAGNOSIS — I10 Essential (primary) hypertension: Secondary | ICD-10-CM | POA: Diagnosis not present

## 2014-07-22 DIAGNOSIS — R011 Cardiac murmur, unspecified: Secondary | ICD-10-CM | POA: Diagnosis not present

## 2014-07-22 DIAGNOSIS — K219 Gastro-esophageal reflux disease without esophagitis: Secondary | ICD-10-CM | POA: Diagnosis not present

## 2014-07-22 DIAGNOSIS — Z79899 Other long term (current) drug therapy: Secondary | ICD-10-CM | POA: Diagnosis not present

## 2014-08-06 ENCOUNTER — Other Ambulatory Visit: Payer: Self-pay

## 2014-08-06 ENCOUNTER — Other Ambulatory Visit: Payer: Self-pay | Admitting: Cardiology

## 2014-08-06 MED ORDER — ATORVASTATIN CALCIUM 80 MG PO TABS: ORAL_TABLET | ORAL | Status: DC

## 2014-09-16 DIAGNOSIS — M7062 Trochanteric bursitis, left hip: Secondary | ICD-10-CM | POA: Diagnosis not present

## 2014-09-16 DIAGNOSIS — M545 Low back pain: Secondary | ICD-10-CM | POA: Diagnosis not present

## 2014-10-28 DIAGNOSIS — M25552 Pain in left hip: Secondary | ICD-10-CM | POA: Diagnosis not present

## 2014-10-28 DIAGNOSIS — M25511 Pain in right shoulder: Secondary | ICD-10-CM | POA: Diagnosis not present

## 2014-10-29 DIAGNOSIS — Z23 Encounter for immunization: Secondary | ICD-10-CM | POA: Diagnosis not present

## 2014-11-03 DIAGNOSIS — M25511 Pain in right shoulder: Secondary | ICD-10-CM | POA: Diagnosis not present

## 2014-11-03 DIAGNOSIS — M25552 Pain in left hip: Secondary | ICD-10-CM | POA: Diagnosis not present

## 2014-11-10 DIAGNOSIS — M25511 Pain in right shoulder: Secondary | ICD-10-CM | POA: Diagnosis not present

## 2014-11-10 DIAGNOSIS — M25552 Pain in left hip: Secondary | ICD-10-CM | POA: Diagnosis not present

## 2014-11-22 DIAGNOSIS — M25511 Pain in right shoulder: Secondary | ICD-10-CM | POA: Diagnosis not present

## 2014-11-22 DIAGNOSIS — M25552 Pain in left hip: Secondary | ICD-10-CM | POA: Diagnosis not present

## 2015-02-16 DIAGNOSIS — K219 Gastro-esophageal reflux disease without esophagitis: Secondary | ICD-10-CM | POA: Diagnosis not present

## 2015-02-16 DIAGNOSIS — R011 Cardiac murmur, unspecified: Secondary | ICD-10-CM | POA: Diagnosis not present

## 2015-02-16 DIAGNOSIS — I10 Essential (primary) hypertension: Secondary | ICD-10-CM | POA: Diagnosis not present

## 2015-02-16 DIAGNOSIS — M189 Osteoarthritis of first carpometacarpal joint, unspecified: Secondary | ICD-10-CM | POA: Diagnosis not present

## 2015-02-16 DIAGNOSIS — E7800 Pure hypercholesterolemia, unspecified: Secondary | ICD-10-CM | POA: Diagnosis not present

## 2015-02-16 DIAGNOSIS — C61 Malignant neoplasm of prostate: Secondary | ICD-10-CM | POA: Diagnosis not present

## 2015-02-16 DIAGNOSIS — I2510 Atherosclerotic heart disease of native coronary artery without angina pectoris: Secondary | ICD-10-CM | POA: Diagnosis not present

## 2015-02-16 DIAGNOSIS — J385 Laryngeal spasm: Secondary | ICD-10-CM | POA: Diagnosis not present

## 2015-03-02 ENCOUNTER — Other Ambulatory Visit: Payer: Self-pay | Admitting: Cardiology

## 2015-03-28 DIAGNOSIS — Z85828 Personal history of other malignant neoplasm of skin: Secondary | ICD-10-CM | POA: Diagnosis not present

## 2015-03-28 DIAGNOSIS — L570 Actinic keratosis: Secondary | ICD-10-CM | POA: Diagnosis not present

## 2015-03-28 DIAGNOSIS — L578 Other skin changes due to chronic exposure to nonionizing radiation: Secondary | ICD-10-CM | POA: Diagnosis not present

## 2015-03-28 DIAGNOSIS — L821 Other seborrheic keratosis: Secondary | ICD-10-CM | POA: Diagnosis not present

## 2015-03-28 DIAGNOSIS — D485 Neoplasm of uncertain behavior of skin: Secondary | ICD-10-CM | POA: Diagnosis not present

## 2015-05-29 ENCOUNTER — Other Ambulatory Visit: Payer: Self-pay | Admitting: Cardiology

## 2015-06-13 DIAGNOSIS — M25511 Pain in right shoulder: Secondary | ICD-10-CM | POA: Diagnosis not present

## 2015-06-13 DIAGNOSIS — M25552 Pain in left hip: Secondary | ICD-10-CM | POA: Diagnosis not present

## 2015-06-20 DIAGNOSIS — M25552 Pain in left hip: Secondary | ICD-10-CM | POA: Diagnosis not present

## 2015-06-20 DIAGNOSIS — M25511 Pain in right shoulder: Secondary | ICD-10-CM | POA: Diagnosis not present

## 2015-06-28 DIAGNOSIS — M25552 Pain in left hip: Secondary | ICD-10-CM | POA: Diagnosis not present

## 2015-06-28 DIAGNOSIS — M25511 Pain in right shoulder: Secondary | ICD-10-CM | POA: Diagnosis not present

## 2015-07-01 DIAGNOSIS — M25552 Pain in left hip: Secondary | ICD-10-CM | POA: Diagnosis not present

## 2015-07-01 DIAGNOSIS — M25511 Pain in right shoulder: Secondary | ICD-10-CM | POA: Diagnosis not present

## 2015-07-28 DIAGNOSIS — Z0001 Encounter for general adult medical examination with abnormal findings: Secondary | ICD-10-CM | POA: Diagnosis not present

## 2015-07-28 DIAGNOSIS — Z1389 Encounter for screening for other disorder: Secondary | ICD-10-CM | POA: Diagnosis not present

## 2015-07-28 DIAGNOSIS — J385 Laryngeal spasm: Secondary | ICD-10-CM | POA: Diagnosis not present

## 2015-07-28 DIAGNOSIS — I2510 Atherosclerotic heart disease of native coronary artery without angina pectoris: Secondary | ICD-10-CM | POA: Diagnosis not present

## 2015-07-28 DIAGNOSIS — Z79899 Other long term (current) drug therapy: Secondary | ICD-10-CM | POA: Diagnosis not present

## 2015-07-28 DIAGNOSIS — I10 Essential (primary) hypertension: Secondary | ICD-10-CM | POA: Diagnosis not present

## 2015-07-28 DIAGNOSIS — M189 Osteoarthritis of first carpometacarpal joint, unspecified: Secondary | ICD-10-CM | POA: Diagnosis not present

## 2015-07-28 DIAGNOSIS — E7800 Pure hypercholesterolemia, unspecified: Secondary | ICD-10-CM | POA: Diagnosis not present

## 2015-07-28 DIAGNOSIS — C61 Malignant neoplasm of prostate: Secondary | ICD-10-CM | POA: Diagnosis not present

## 2015-07-28 DIAGNOSIS — R011 Cardiac murmur, unspecified: Secondary | ICD-10-CM | POA: Diagnosis not present

## 2015-07-28 DIAGNOSIS — K219 Gastro-esophageal reflux disease without esophagitis: Secondary | ICD-10-CM | POA: Diagnosis not present

## 2015-08-17 DIAGNOSIS — M25511 Pain in right shoulder: Secondary | ICD-10-CM | POA: Diagnosis not present

## 2015-08-17 DIAGNOSIS — M25552 Pain in left hip: Secondary | ICD-10-CM | POA: Diagnosis not present

## 2015-08-22 DIAGNOSIS — M25552 Pain in left hip: Secondary | ICD-10-CM | POA: Diagnosis not present

## 2015-08-22 DIAGNOSIS — M25511 Pain in right shoulder: Secondary | ICD-10-CM | POA: Diagnosis not present

## 2015-08-24 DIAGNOSIS — M25511 Pain in right shoulder: Secondary | ICD-10-CM | POA: Diagnosis not present

## 2015-08-24 DIAGNOSIS — M25552 Pain in left hip: Secondary | ICD-10-CM | POA: Diagnosis not present

## 2015-09-27 ENCOUNTER — Encounter: Payer: Self-pay | Admitting: Cardiology

## 2015-09-27 ENCOUNTER — Ambulatory Visit (INDEPENDENT_AMBULATORY_CARE_PROVIDER_SITE_OTHER): Payer: Medicare Other | Admitting: Cardiology

## 2015-09-27 VITALS — BP 136/86 | HR 88 | Ht 67.0 in | Wt 156.4 lb

## 2015-09-27 DIAGNOSIS — I10 Essential (primary) hypertension: Secondary | ICD-10-CM

## 2015-09-27 DIAGNOSIS — E785 Hyperlipidemia, unspecified: Secondary | ICD-10-CM

## 2015-09-27 DIAGNOSIS — I2510 Atherosclerotic heart disease of native coronary artery without angina pectoris: Secondary | ICD-10-CM | POA: Diagnosis not present

## 2015-09-27 NOTE — Patient Instructions (Signed)
Medication Instructions:  The current medical regimen is effective;  continue present plan and medications.  Follow-Up: Follow up in 2 years with Dr. Skains.  You will receive a letter in the mail 2 months before you are due.  Please call us when you receive this letter to schedule your follow up appointment.  If you need a refill on your cardiac medications before your next appointment, please call your pharmacy.  Thank you for choosing [facility]!!     

## 2015-09-27 NOTE — Progress Notes (Signed)
[name]. [address] [place], [place]  [address] Phone: [phone] Fax:  [phone]  Date:  09/27/2015   ID:  Gary Armstrong, DOB 10-Oct-1929, MRN [id number]  PCP:  Henrine Screws, MD   History of Present Illness: Gary Armstrong is a 82 y.o. male with CAD, RCA stent 06/10/09, Promus dilated to 4.53mm with residual mid LAD disease of 70% here for follow up. Was on Effient. Doing very well without any anginal symptoms. Exercising. Enjoying life.   He was seen in the emergency department after a syncopal episode occurred during cardiac rehabilitation. His lisinopril was stopped transiently but he is back on. It was felt to be neurocardiogenic or vasovagal.  He currently is doing very well, no limitations. No chest pain, no shortness of breath. we discussed that he may switch to low-dose aspirin. 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 He himself is still working daily.  They're friends with Dr. Velora Heckler. No syncope, no bleeding, no orthopnea.   His lipids have improved dramatically with an LDL reduction from 170s in 2007 now down to 70. I switched him in December of 2011 to Lipitor 40 mg once a day.      Wt Readings from Last 3 Encounters:  09/27/15 156 lb 6.4 oz (70.9 kg)  06/04/13 158 lb (71.7 kg)  01/25/11 160 lb 9.6 oz (72.8 kg)     Past Medical History:  Diagnosis Date  . Heart disease   . History of prostate cancer 06/2009  . Hyperlipidemia   . Hypertension     Past Surgical History:  Procedure Laterality Date  . CORONARY STENT PLACEMENT  06/2009  . HERNIA REPAIR  01/10/11   rih  . INSERTION PROSTATE RADIATION SEED      Current Outpatient Prescriptions  Medication Sig Dispense Refill  . acetaminophen-codeine 120-12 MG/5ML suspension Take 5 mLs by mouth every 4 (four) hours as needed for pain.    Marland Kitchen aspirin 81 MG tablet Take 81 mg by mouth daily.    Marland Kitchen atorvastatin (LIPITOR) 80 MG tablet TAKE 1/2 TABLET BY MOUTH ONCE A DAY  45 tablet 2  . cholecalciferol (VITAMIN D) 1000 UNITS tablet Take 1,000 Units by mouth daily.    . fish oil-omega-3 fatty acids 1000 MG capsule Take 1 g by mouth daily.     Marland Kitchen glucosamine-chondroitin 500-400 MG tablet Take 1 tablet by mouth daily.     . lansoprazole (PREVACID) 15 MG capsule Take 15 mg by mouth daily.     Marland Kitchen lisinopril (PRINIVIL,ZESTRIL) 5 MG tablet Take 5 mg by mouth daily.     . Multiple Vitamins-Minerals (MULTIVITAMIN WITH MINERALS) tablet Take 1 tablet by mouth daily.    Marland Kitchen NITROSTAT 0.4 MG SL tablet DISSOLVE ONE TABLET UNDER THE TONGUE AS NEEDED FOR CHEST PAIN AS DIRECTED 25 tablet 2  . sildenafil (VIAGRA) 100 MG tablet Take 100 mg by mouth daily as needed for erectile dysfunction.    . Tamsulosin HCl (FLOMAX) 0.4 MG CAPS Take 0.4 mg by mouth daily.       No current facility-administered medications for this visit.     Allergies:   No Known Allergies  Social History:  The patient  reports that he has never smoked. He has never used smokeless tobacco. He reports that he drinks alcohol. He reports that he does not use drugs.   Family History  Problem Relation Age of Onset  . Emphysema Mother  ROS:  Please see the history of present illness.   Denies any fevers, chills, orthopnea, PND   All other systems reviewed and negative.   PHYSICAL EXAM: VS:  BP 136/86   Pulse 88   Ht 5\' 7"  (1.702 m)   Wt 156 lb 6.4 oz (70.9 kg)   SpO2 98%   BMI 24.50 kg/m  Well nourished, well developed, in no acute distress  HEENT: normal, [place]/AT, EOMI Neck: no JVD, normal carotid upstroke, no bruit Cardiac:  normal S1, S2; RRR; 1/6 S murmur  Lungs:  clear to auscultation bilaterally, no wheezing, rhonchi or rales  Abd: soft, nontender, no hepatomegaly, no bruits  Ext: no edema, 2+ distal pulses Skin: warm and dry  GU: deferred Neuro: no focal abnormalities noted, AAO x 3  EKG:  Today-09/27/15-sinus rhythm, first-degree AV block PR interval 240 ms, PACs, PVC noted, poor R-wave  progression personally viewed-no significant change from prior 06/04/13 Sinus rhythm, first degree AV block, heart rate 93, PR interval 258 ms, vertical axis     ECHO 06/19/13:  - Left ventricle: The cavity size was normal. There was mild focal basal hypertrophy of the septum. Systolic function was normal. The estimated ejection fraction was in the range of 60% to 65%. Wall motion was normal; there were no regional wall motion abnormalities. Doppler parameters are consistent with abnormal left ventricular relaxation (grade 1 diastolic dysfunction). - Aortic valve: There was very mild stenosis. Mild regurgitation. - Mitral valve: Calcified annulus. - Atrial septum: There was an atrial septal aneurysm.  ASSESSMENT AND PLAN:  1. Mild aortic stenosis -stable. Minimal murmur heard on exam 2. CAD-no anginal symptoms. Doing very well. Aspirin only. 3. Hyperlipidemia-atorvastatin 40 mg. LDL 81. Overall good control. No changes made. Diet, exercise. 4. Hypertension- good control. No changes made. 5. 2 year followup  Signed, Candee Furbish, MD [facility]  09/27/2015 9:10 AM

## 2015-09-27 NOTE — Addendum Note (Signed)
Addended by: Shellia Cleverly on: 09/27/2015 04:33 PM   Modules accepted: Orders

## 2015-11-16 DIAGNOSIS — M25552 Pain in left hip: Secondary | ICD-10-CM | POA: Diagnosis not present

## 2015-11-16 DIAGNOSIS — M25511 Pain in right shoulder: Secondary | ICD-10-CM | POA: Diagnosis not present

## 2015-11-17 DIAGNOSIS — M19011 Primary osteoarthritis, right shoulder: Secondary | ICD-10-CM | POA: Diagnosis not present

## 2015-11-22 DIAGNOSIS — H2513 Age-related nuclear cataract, bilateral: Secondary | ICD-10-CM | POA: Diagnosis not present

## 2015-11-23 DIAGNOSIS — Z23 Encounter for immunization: Secondary | ICD-10-CM | POA: Diagnosis not present

## 2015-12-22 DIAGNOSIS — M25511 Pain in right shoulder: Secondary | ICD-10-CM | POA: Diagnosis not present

## 2015-12-22 DIAGNOSIS — M25552 Pain in left hip: Secondary | ICD-10-CM | POA: Diagnosis not present

## 2016-01-09 DIAGNOSIS — M25511 Pain in right shoulder: Secondary | ICD-10-CM | POA: Diagnosis not present

## 2016-01-09 DIAGNOSIS — M25552 Pain in left hip: Secondary | ICD-10-CM | POA: Diagnosis not present

## 2016-01-10 DIAGNOSIS — M1711 Unilateral primary osteoarthritis, right knee: Secondary | ICD-10-CM | POA: Diagnosis not present

## 2016-01-25 DIAGNOSIS — R151 Fecal smearing: Secondary | ICD-10-CM | POA: Diagnosis not present

## 2016-01-25 DIAGNOSIS — I10 Essential (primary) hypertension: Secondary | ICD-10-CM | POA: Diagnosis not present

## 2016-01-25 DIAGNOSIS — C61 Malignant neoplasm of prostate: Secondary | ICD-10-CM | POA: Diagnosis not present

## 2016-01-25 DIAGNOSIS — M189 Osteoarthritis of first carpometacarpal joint, unspecified: Secondary | ICD-10-CM | POA: Diagnosis not present

## 2016-01-25 DIAGNOSIS — R011 Cardiac murmur, unspecified: Secondary | ICD-10-CM | POA: Diagnosis not present

## 2016-01-25 DIAGNOSIS — Z79899 Other long term (current) drug therapy: Secondary | ICD-10-CM | POA: Diagnosis not present

## 2016-01-25 DIAGNOSIS — I2510 Atherosclerotic heart disease of native coronary artery without angina pectoris: Secondary | ICD-10-CM | POA: Diagnosis not present

## 2016-01-25 DIAGNOSIS — E7800 Pure hypercholesterolemia, unspecified: Secondary | ICD-10-CM | POA: Diagnosis not present

## 2016-02-09 DIAGNOSIS — R151 Fecal smearing: Secondary | ICD-10-CM | POA: Diagnosis not present

## 2016-02-09 DIAGNOSIS — I10 Essential (primary) hypertension: Secondary | ICD-10-CM | POA: Diagnosis not present

## 2016-02-09 DIAGNOSIS — I2510 Atherosclerotic heart disease of native coronary artery without angina pectoris: Secondary | ICD-10-CM | POA: Diagnosis not present

## 2016-02-13 DIAGNOSIS — M25552 Pain in left hip: Secondary | ICD-10-CM | POA: Diagnosis not present

## 2016-02-13 DIAGNOSIS — M25511 Pain in right shoulder: Secondary | ICD-10-CM | POA: Diagnosis not present

## 2016-03-19 DIAGNOSIS — M25552 Pain in left hip: Secondary | ICD-10-CM | POA: Diagnosis not present

## 2016-03-19 DIAGNOSIS — M25511 Pain in right shoulder: Secondary | ICD-10-CM | POA: Diagnosis not present

## 2016-03-26 DIAGNOSIS — I10 Essential (primary) hypertension: Secondary | ICD-10-CM | POA: Diagnosis not present

## 2016-07-31 DIAGNOSIS — E7800 Pure hypercholesterolemia, unspecified: Secondary | ICD-10-CM | POA: Diagnosis not present

## 2016-07-31 DIAGNOSIS — R011 Cardiac murmur, unspecified: Secondary | ICD-10-CM | POA: Diagnosis not present

## 2016-07-31 DIAGNOSIS — Z0000 Encounter for general adult medical examination without abnormal findings: Secondary | ICD-10-CM | POA: Diagnosis not present

## 2016-07-31 DIAGNOSIS — K219 Gastro-esophageal reflux disease without esophagitis: Secondary | ICD-10-CM | POA: Diagnosis not present

## 2016-07-31 DIAGNOSIS — J385 Laryngeal spasm: Secondary | ICD-10-CM | POA: Diagnosis not present

## 2016-07-31 DIAGNOSIS — Z1389 Encounter for screening for other disorder: Secondary | ICD-10-CM | POA: Diagnosis not present

## 2016-07-31 DIAGNOSIS — I10 Essential (primary) hypertension: Secondary | ICD-10-CM | POA: Diagnosis not present

## 2016-07-31 DIAGNOSIS — R413 Other amnesia: Secondary | ICD-10-CM | POA: Diagnosis not present

## 2016-07-31 DIAGNOSIS — I2510 Atherosclerotic heart disease of native coronary artery without angina pectoris: Secondary | ICD-10-CM | POA: Diagnosis not present

## 2016-07-31 DIAGNOSIS — Z79899 Other long term (current) drug therapy: Secondary | ICD-10-CM | POA: Diagnosis not present

## 2016-07-31 DIAGNOSIS — Z125 Encounter for screening for malignant neoplasm of prostate: Secondary | ICD-10-CM | POA: Diagnosis not present

## 2016-07-31 DIAGNOSIS — Z23 Encounter for immunization: Secondary | ICD-10-CM | POA: Diagnosis not present

## 2016-07-31 DIAGNOSIS — M189 Osteoarthritis of first carpometacarpal joint, unspecified: Secondary | ICD-10-CM | POA: Diagnosis not present

## 2016-08-27 DIAGNOSIS — M1711 Unilateral primary osteoarthritis, right knee: Secondary | ICD-10-CM | POA: Diagnosis not present

## 2016-10-22 DIAGNOSIS — L821 Other seborrheic keratosis: Secondary | ICD-10-CM | POA: Diagnosis not present

## 2016-10-22 DIAGNOSIS — L570 Actinic keratosis: Secondary | ICD-10-CM | POA: Diagnosis not present

## 2016-10-22 DIAGNOSIS — D485 Neoplasm of uncertain behavior of skin: Secondary | ICD-10-CM | POA: Diagnosis not present

## 2016-10-22 DIAGNOSIS — L905 Scar conditions and fibrosis of skin: Secondary | ICD-10-CM | POA: Diagnosis not present

## 2016-10-22 DIAGNOSIS — Z85828 Personal history of other malignant neoplasm of skin: Secondary | ICD-10-CM | POA: Diagnosis not present

## 2016-11-06 DIAGNOSIS — B353 Tinea pedis: Secondary | ICD-10-CM | POA: Diagnosis not present

## 2016-11-06 DIAGNOSIS — Z85828 Personal history of other malignant neoplasm of skin: Secondary | ICD-10-CM | POA: Diagnosis not present

## 2016-11-22 DIAGNOSIS — M1711 Unilateral primary osteoarthritis, right knee: Secondary | ICD-10-CM | POA: Diagnosis not present

## 2016-12-13 DIAGNOSIS — M1711 Unilateral primary osteoarthritis, right knee: Secondary | ICD-10-CM | POA: Diagnosis not present

## 2016-12-14 DIAGNOSIS — Z23 Encounter for immunization: Secondary | ICD-10-CM | POA: Diagnosis not present

## 2016-12-20 DIAGNOSIS — M1711 Unilateral primary osteoarthritis, right knee: Secondary | ICD-10-CM | POA: Diagnosis not present

## 2016-12-31 DIAGNOSIS — M25511 Pain in right shoulder: Secondary | ICD-10-CM | POA: Diagnosis not present

## 2017-01-02 DIAGNOSIS — R011 Cardiac murmur, unspecified: Secondary | ICD-10-CM | POA: Diagnosis not present

## 2017-01-02 DIAGNOSIS — J385 Laryngeal spasm: Secondary | ICD-10-CM | POA: Diagnosis not present

## 2017-01-02 DIAGNOSIS — I10 Essential (primary) hypertension: Secondary | ICD-10-CM | POA: Diagnosis not present

## 2017-01-02 DIAGNOSIS — H5203 Hypermetropia, bilateral: Secondary | ICD-10-CM | POA: Diagnosis not present

## 2017-01-02 DIAGNOSIS — Z79899 Other long term (current) drug therapy: Secondary | ICD-10-CM | POA: Diagnosis not present

## 2017-01-02 DIAGNOSIS — K219 Gastro-esophageal reflux disease without esophagitis: Secondary | ICD-10-CM | POA: Diagnosis not present

## 2017-01-02 DIAGNOSIS — Z8546 Personal history of malignant neoplasm of prostate: Secondary | ICD-10-CM | POA: Diagnosis not present

## 2017-01-02 DIAGNOSIS — E7800 Pure hypercholesterolemia, unspecified: Secondary | ICD-10-CM | POA: Diagnosis not present

## 2017-01-02 DIAGNOSIS — H25013 Cortical age-related cataract, bilateral: Secondary | ICD-10-CM | POA: Diagnosis not present

## 2017-01-02 DIAGNOSIS — R413 Other amnesia: Secondary | ICD-10-CM | POA: Diagnosis not present

## 2017-01-02 DIAGNOSIS — M189 Osteoarthritis of first carpometacarpal joint, unspecified: Secondary | ICD-10-CM | POA: Diagnosis not present

## 2017-01-02 DIAGNOSIS — I2510 Atherosclerotic heart disease of native coronary artery without angina pectoris: Secondary | ICD-10-CM | POA: Diagnosis not present

## 2017-01-02 DIAGNOSIS — H2513 Age-related nuclear cataract, bilateral: Secondary | ICD-10-CM | POA: Diagnosis not present

## 2017-01-02 DIAGNOSIS — R05 Cough: Secondary | ICD-10-CM | POA: Diagnosis not present

## 2017-01-04 DIAGNOSIS — H2513 Age-related nuclear cataract, bilateral: Secondary | ICD-10-CM | POA: Diagnosis not present

## 2017-01-07 DIAGNOSIS — M25511 Pain in right shoulder: Secondary | ICD-10-CM | POA: Diagnosis not present

## 2017-02-14 DIAGNOSIS — M25511 Pain in right shoulder: Secondary | ICD-10-CM | POA: Diagnosis not present

## 2017-02-19 DIAGNOSIS — H25811 Combined forms of age-related cataract, right eye: Secondary | ICD-10-CM | POA: Diagnosis not present

## 2017-02-19 DIAGNOSIS — H25011 Cortical age-related cataract, right eye: Secondary | ICD-10-CM | POA: Diagnosis not present

## 2017-02-19 DIAGNOSIS — H2511 Age-related nuclear cataract, right eye: Secondary | ICD-10-CM | POA: Diagnosis not present

## 2017-03-21 DIAGNOSIS — M19011 Primary osteoarthritis, right shoulder: Secondary | ICD-10-CM | POA: Diagnosis not present

## 2017-04-16 DIAGNOSIS — H25812 Combined forms of age-related cataract, left eye: Secondary | ICD-10-CM | POA: Diagnosis not present

## 2017-04-16 DIAGNOSIS — H2512 Age-related nuclear cataract, left eye: Secondary | ICD-10-CM | POA: Diagnosis not present

## 2017-04-16 DIAGNOSIS — H25012 Cortical age-related cataract, left eye: Secondary | ICD-10-CM | POA: Diagnosis not present

## 2017-04-25 ENCOUNTER — Telehealth: Payer: Self-pay | Admitting: *Deleted

## 2017-04-25 NOTE — Telephone Encounter (Signed)
Pt called left his name and phone number. I called and he said he needed to schedule an appt with Norm it was personal.

## 2017-04-29 DIAGNOSIS — Z85828 Personal history of other malignant neoplasm of skin: Secondary | ICD-10-CM | POA: Diagnosis not present

## 2017-04-29 DIAGNOSIS — L570 Actinic keratosis: Secondary | ICD-10-CM | POA: Diagnosis not present

## 2017-07-23 ENCOUNTER — Other Ambulatory Visit: Payer: Self-pay | Admitting: Orthopedic Surgery

## 2017-07-23 ENCOUNTER — Telehealth: Payer: Self-pay | Admitting: Cardiology

## 2017-07-23 DIAGNOSIS — M1711 Unilateral primary osteoarthritis, right knee: Secondary | ICD-10-CM | POA: Diagnosis not present

## 2017-07-23 DIAGNOSIS — M25511 Pain in right shoulder: Secondary | ICD-10-CM

## 2017-07-23 DIAGNOSIS — M19011 Primary osteoarthritis, right shoulder: Secondary | ICD-10-CM | POA: Diagnosis not present

## 2017-07-23 NOTE — Telephone Encounter (Signed)
   [facility] Pre-operative Risk Assessment    Request for surgical clearance:  1. What type of surgery is being performed?  Right Total Shoulder Replacement   2. When is this surgery scheduled?  Pending   3. What type of clearance is required (medical clearance vs. Pharmacy clearance to hold med vs. Both)?  Medical  4. Are there any medications that need to be held prior to surgery and how long? N/A   5. Practice name and name of physician performing surgery?  [facility]- Dr. Griffin Basil   6. What is your office phone number? [place]    7.   What is your office fax number? [place]   Anesthesia type (None, local, MAC, general) ?  Not listed   _________________________________________________________________   (provider comments below)

## 2017-07-24 NOTE — Telephone Encounter (Signed)
Pt is already scheduled to see Dr.Skains on 09/16/17. I have call the requesting office and notified them of pt's appointment.

## 2017-07-24 NOTE — Telephone Encounter (Signed)
   Primary Cardiologist:[name], MD  Chart reviewed as part of pre-operative protocol coverage. Because of Gary Armstrong's past medical history and time since last visit in 09/2015, he/she will require a follow-up visit in order to better assess preoperative cardiovascular risk.  Pre-op covering staff: - Please schedule appointment and call patient to inform them. - Please contact requesting surgeon's office via preferred method (i.e, phone, fax) to inform them of need for appointment prior to surgery.  Daune Perch, NP  07/24/2017, 12:51 PM

## 2017-07-30 DIAGNOSIS — M1711 Unilateral primary osteoarthritis, right knee: Secondary | ICD-10-CM | POA: Diagnosis not present

## 2017-08-06 ENCOUNTER — Other Ambulatory Visit: Payer: Self-pay | Admitting: Orthopedic Surgery

## 2017-08-06 DIAGNOSIS — M1711 Unilateral primary osteoarthritis, right knee: Secondary | ICD-10-CM | POA: Diagnosis not present

## 2017-08-06 DIAGNOSIS — M25511 Pain in right shoulder: Secondary | ICD-10-CM

## 2017-08-06 DIAGNOSIS — Z6822 Body mass index (BMI) 22.0-22.9, adult: Secondary | ICD-10-CM | POA: Diagnosis not present

## 2017-08-07 ENCOUNTER — Ambulatory Visit
Admission: RE | Admit: 2017-08-07 | Discharge: 2017-08-07 | Disposition: A | Discharge status: Home / Self Care | Payer: Medicare Other | Source: Ambulatory Visit | Attending: Orthopedic Surgery | Admitting: Orthopedic Surgery

## 2017-08-07 DIAGNOSIS — M25511 Pain in right shoulder: Secondary | ICD-10-CM

## 2017-08-07 DIAGNOSIS — M19011 Primary osteoarthritis, right shoulder: Secondary | ICD-10-CM | POA: Diagnosis not present

## 2017-08-12 ENCOUNTER — Telehealth: Payer: Self-pay | Admitting: Cardiology

## 2017-08-12 DIAGNOSIS — M1711 Unilateral primary osteoarthritis, right knee: Secondary | ICD-10-CM | POA: Diagnosis not present

## 2017-08-12 NOTE — Telephone Encounter (Signed)
Ok to double book July 15 at [place], MD

## 2017-08-12 NOTE — Telephone Encounter (Signed)
New Message      Patient is in a lot of pain, office is calling to see if we can move the surgical clearance appt up. I looked for availability there was none.

## 2017-08-12 NOTE — Telephone Encounter (Signed)
Left message with Sherrie informing her that 8/12 in the first available appt with Dr. Marlou Porch. Informed her that I would send a message to Dr. Marlou Porch, if he would like to move patient up to a sooner appt. I would give her a call back.

## 2017-08-12 NOTE — Telephone Encounter (Signed)
I spoke with patient and informed him that Dr. Marlou Porch is willing to have patient come in on July 15 at [place]. He is in agreement with plan. Pt is scheduled at 10am, but patient is aware to come in at 8 am. He verbalized understanding and thankful for the call.

## 2017-08-19 ENCOUNTER — Encounter: Payer: Self-pay | Admitting: Cardiology

## 2017-08-19 ENCOUNTER — Ambulatory Visit (INDEPENDENT_AMBULATORY_CARE_PROVIDER_SITE_OTHER): Payer: Medicare Other | Admitting: Cardiology

## 2017-08-19 ENCOUNTER — Encounter (INDEPENDENT_AMBULATORY_CARE_PROVIDER_SITE_OTHER): Payer: Self-pay

## 2017-08-19 ENCOUNTER — Encounter: Payer: Self-pay | Admitting: *Deleted

## 2017-08-19 VITALS — BP 146/80 | HR 80 | Ht 67.0 in | Wt 154.0 lb

## 2017-08-19 DIAGNOSIS — I350 Nonrheumatic aortic (valve) stenosis: Secondary | ICD-10-CM

## 2017-08-19 DIAGNOSIS — Z01810 Encounter for preprocedural cardiovascular examination: Secondary | ICD-10-CM | POA: Diagnosis not present

## 2017-08-19 DIAGNOSIS — I10 Essential (primary) hypertension: Secondary | ICD-10-CM

## 2017-08-19 DIAGNOSIS — E7800 Pure hypercholesterolemia, unspecified: Secondary | ICD-10-CM

## 2017-08-19 NOTE — Patient Instructions (Signed)
Medication Instructions:  Your physician recommends that you continue on your current medications as directed. Please refer to the Current Medication list given to you today.   Labwork: -None  Testing/Procedures: Your physician has requested that you have an echocardiogram. Echocardiography is a painless test that uses sound waves to create images of your heart. It provides your doctor with information about the size and shape of your heart and how well your heart's chambers and valves are working. This procedure takes approximately one hour. There are no restrictions for this procedure.    Follow-Up: Your physician wants you to follow-up in: 1 year with Dr. Skain's.  You will receive a reminder letter in the mail two months in advance. If you don't receive a letter, please call our office to schedule the follow-up appointment.   Any Other Special Instructions Will Be Listed Below (If Applicable).     If you need a refill on your cardiac medications before your next appointment, please call your pharmacy.   

## 2017-08-19 NOTE — Progress Notes (Signed)
[name]. [address], [address] [place], [place]  [address] Phone: [phone] Fax:  [phone]  Date:  08/19/2017   ID:  Gary GONGAWARE, DOB Feb 27, 1929, MRN [id number]  PCP:  Josetta Huddle, MD   History of Present Illness: Gary Armstrong is a 84 y.o. male with CAD, RCA stent 06/10/09, Promus dilated to 4.33mm with residual mid LAD disease of 70% here for follow up. Was on Effient. Doing very well without any anginal symptoms. Exercising. Enjoying life.   He was seen in the emergency department after a syncopal episode occurred during cardiac rehabilitation. His lisinopril was stopped transiently but he is back on. It was felt to be neurocardiogenic or vasovagal.  He currently is doing very well, no limitations. No chest pain, no shortness of breath. we discussed that he may switch to low-dose aspirin. 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 He himself is still working daily.  They're friends with Dr. Velora Heckler. No syncope, no bleeding, no orthopnea.   His lipids have improved dramatically with an LDL reduction from 170s in 2007 now down to 70. I switched him in December of 2011 to Lipitor 40 mg once a day.  08/19/17 - Dr. Mardelle Matte. Shoulder. Swim 4 days a week, treadmill 3 days a week.  He is doing excellently.  No syncopal symptoms no orthopnea PND bleeding.  Ready to have his surgery.  He enjoys playing golf but has not been able to do so over the past few months.  His son continues to direct in [place].    Wt Readings from Last 3 Encounters:  08/19/17 154 lb (69.9 kg)  09/27/15 156 lb 6.4 oz (70.9 kg)  06/04/13 158 lb (71.7 kg)     Past Medical History:  Diagnosis Date  . Heart disease   . History of prostate cancer 06/2009  . Hyperlipidemia   . Hypertension     Past Surgical History:  Procedure Laterality Date  . CORONARY STENT PLACEMENT  06/2009  . HERNIA REPAIR  01/10/11   ri[facility]  . INSERTION PROSTATE RADIATION SEED      Current  Outpatient Medications  Medication Sig Dispense Refill  . acetaminophen-codeine 120-12 MG/5ML suspension Take 5 mLs by mouth every 4 (four) hours as needed for pain.    Marland Kitchen aspirin 81 MG tablet Take 81 mg by mouth daily.    Marland Kitchen atorvastatin (LIPITOR) 80 MG tablet TAKE 1/2 TABLET BY MOUTH ONCE A DAY 45 tablet 2  . cholecalciferol (VITAMIN D) 1000 UNITS tablet Take 1,000 Units by mouth daily.    . fish oil-omega-3 fatty acids 1000 MG capsule Take 1 g by mouth daily.     Marland Kitchen glucosamine-chondroitin 500-400 MG tablet Take 1 tablet by mouth daily.     . lansoprazole (PREVACID) 15 MG capsule Take 15 mg by mouth daily.     Marland Kitchen lisinopril (PRINIVIL,ZESTRIL) 5 MG tablet Take 5 mg by mouth daily.     . Multiple Vitamins-Minerals (MULTIVITAMIN WITH MINERALS) tablet Take 1 tablet by mouth daily.    Marland Kitchen NITROSTAT 0.4 MG SL tablet DISSOLVE ONE TABLET UNDER THE TONGUE AS NEEDED FOR CHEST PAIN AS DIRECTED 25 tablet 2  . sildenafil (VIAGRA) 100 MG tablet Take 100 mg by mouth daily as needed for erectile dysfunction.    . Tamsulosin HCl (FLOMAX) 0.4 MG CAPS Take 0.4 mg by mouth daily.       No current facility-administered medications for this visit.  Allergies:   No Known Allergies  Social History:  The patient  reports that he has never smoked. He has never used smokeless tobacco. He reports that he drinks alcohol. He reports that he does not use drugs.   Family History  Problem Relation Age of Onset  . Emphysema Mother     ROS:  Please see the history of present illness.   Denies any fevers, chills, orthopnea, PND   All other systems reviewed and negative.   PHYSICAL EXAM: VS:  BP (!) 146/80   Pulse 80   Ht 5\' 7"  (1.702 m)   Wt 154 lb (69.9 kg)   SpO2 96%   BMI 24.12 kg/m  Well nourished, well developed, in no acute distress  HEENT: normal, [place]/AT, EOMI Neck: no JVD, normal carotid upstroke, no bruit Cardiac:  normal S1, S2; RRR; 1/6 S murmur  Lungs:  clear to auscultation bilaterally, no wheezing,  rhonchi or rales  Abd: soft, nontender, no hepatomegaly, no bruits  Ext: no edema, 2+ distal pulses Skin: warm and dry  GU: deferred Neuro: no focal abnormalities noted, AAO x 3  EKG:  Today- 08/19/2017-sinus rhythm 80 first-degree AV block left axis deviation 09/27/15-sinus rhythm, first-degree AV block PR interval 240 ms, PACs, PVC noted, poor R-wave progression personally viewed-no significant change from prior 06/04/13 Sinus rhythm, first degree AV block, heart rate 93, PR interval 258 ms, vertical axis     ECHO 06/19/13:  - Left ventricle: The cavity size was normal. There was mild focal basal hypertrophy of the septum. Systolic function was normal. The estimated ejection fraction was in the range of 60% to 65%. Wall motion was normal; there were no regional wall motion abnormalities. Doppler parameters are consistent with abnormal left ventricular relaxation (grade 1 diastolic dysfunction). - Aortic valve: There was very mild stenosis. Mild regurgitation. - Mitral valve: Calcified annulus. - Atrial septum: There was an atrial septal aneurysm.  ASSESSMENT AND PLAN:  Preoperative cardiac risk stratification prior to shoulder surgery - He is able to complete greater than 4 METS of activity without any anginal symptoms.  No syncope, no bleeding, no orthopnea, no PND, no heart failure-like symptoms.  No arrhythmias.  He does have a very mild first-degree AV block which is stable.  Try to avoid AV nodal blocking agents.  He has had very mild aortic stenosis previously described on echocardiogram.  Valvular murmur does not appear severe on exam today.  Echocardiogram has been ordered.  He may proceed with surgery at moderate overall cardiovascular risk based upon his age and prior CAD which is stable.  Very Mild aortic stenosis  -stable. Minimal murmur heard on exam.  Checking echocardiogram  CAD -no anginal symptoms. Doing very well. Aspirin  only.  Hyperlipidemia -atorvastatin 40 mg. LDL 90 Overall good control. No changes made. Diet, exercise.  Hypertension - good overall control. No changes made. Creat 0.7  1 year followup  Signed, Candee Furbish, MD [facility]  08/19/2017 9:15 AM

## 2017-08-27 ENCOUNTER — Other Ambulatory Visit: Payer: Self-pay | Admitting: Orthopedic Surgery

## 2017-08-29 ENCOUNTER — Encounter (HOSPITAL_COMMUNITY)
Admission: RE | Admit: 2017-08-29 | Discharge: 2017-08-29 | Disposition: A | Discharge status: Home / Self Care | Payer: Medicare Other | Source: Ambulatory Visit | Attending: Orthopedic Surgery | Admitting: Orthopedic Surgery

## 2017-08-29 ENCOUNTER — Encounter (HOSPITAL_COMMUNITY): Payer: Self-pay

## 2017-08-29 ENCOUNTER — Other Ambulatory Visit: Payer: Self-pay

## 2017-08-29 DIAGNOSIS — Z955 Presence of coronary angioplasty implant and graft: Secondary | ICD-10-CM | POA: Insufficient documentation

## 2017-08-29 DIAGNOSIS — E785 Hyperlipidemia, unspecified: Secondary | ICD-10-CM | POA: Insufficient documentation

## 2017-08-29 DIAGNOSIS — I10 Essential (primary) hypertension: Secondary | ICD-10-CM | POA: Diagnosis not present

## 2017-08-29 DIAGNOSIS — Z79899 Other long term (current) drug therapy: Secondary | ICD-10-CM | POA: Diagnosis not present

## 2017-08-29 DIAGNOSIS — Z01818 Encounter for other preprocedural examination: Secondary | ICD-10-CM | POA: Insufficient documentation

## 2017-08-29 DIAGNOSIS — Z7982 Long term (current) use of aspirin: Secondary | ICD-10-CM | POA: Insufficient documentation

## 2017-08-29 DIAGNOSIS — I2510 Atherosclerotic heart disease of native coronary artery without angina pectoris: Secondary | ICD-10-CM | POA: Diagnosis not present

## 2017-08-29 DIAGNOSIS — M19011 Primary osteoarthritis, right shoulder: Secondary | ICD-10-CM | POA: Diagnosis not present

## 2017-08-29 DIAGNOSIS — Z8546 Personal history of malignant neoplasm of prostate: Secondary | ICD-10-CM | POA: Insufficient documentation

## 2017-08-29 DIAGNOSIS — Z9849 Cataract extraction status, unspecified eye: Secondary | ICD-10-CM | POA: Insufficient documentation

## 2017-08-29 DIAGNOSIS — Z923 Personal history of irradiation: Secondary | ICD-10-CM | POA: Diagnosis not present

## 2017-08-29 DIAGNOSIS — I440 Atrioventricular block, first degree: Secondary | ICD-10-CM | POA: Insufficient documentation

## 2017-08-29 DIAGNOSIS — Z01812 Encounter for preprocedural laboratory examination: Secondary | ICD-10-CM | POA: Insufficient documentation

## 2017-08-29 HISTORY — DX: Unspecified osteoarthritis, unspecified site: M19.90

## 2017-08-29 HISTORY — DX: Other complications of anesthesia, initial encounter: T88.59XA

## 2017-08-29 HISTORY — DX: Presence of spectacles and contact lenses: Z97.3

## 2017-08-29 HISTORY — DX: Atherosclerotic heart disease of native coronary artery without angina pectoris: I25.10

## 2017-08-29 HISTORY — DX: Adverse effect of unspecified anesthetic, initial encounter: T41.45XA

## 2017-08-29 HISTORY — DX: Malignant (primary) neoplasm, unspecified: C80.1

## 2017-08-29 LAB — CBC
HCT: 44.6 % (ref 39.0–52.0)
Hemoglobin: 15.2 g/dL (ref 13.0–17.0)
MCH: 29.6 pg (ref 26.0–34.0)
MCHC: 34.1 g/dL (ref 30.0–36.0)
MCV: 86.8 fL (ref 78.0–100.0)
Platelets: 171 10*3/uL (ref 150–400)
RBC: 5.14 MIL/uL (ref 4.22–5.81)
RDW: 13.1 % (ref 11.5–15.5)
WBC: 6 10*3/uL (ref 4.0–10.5)

## 2017-08-29 LAB — BASIC METABOLIC PANEL
Anion gap: 9 (ref 5–15)
BUN: 18 mg/dL (ref 8–23)
CHLORIDE: 107 mmol/L (ref 98–111)
CO2: 24 mmol/L (ref 22–32)
CREATININE: 0.78 mg/dL (ref 0.61–1.24)
Calcium: 9.4 mg/dL (ref 8.9–10.3)
GFR calc non Af Amer: >60 mL/min (ref >60)
Glucose, Bld: 86 mg/dL (ref 70–99)
Potassium: 3.8 mmol/L (ref 3.5–5.1)
Sodium: 140 mmol/L (ref 135–145)

## 2017-08-29 LAB — SURGICAL PCR SCREEN
MRSA, PCR: NEGATIVE
STAPHYLOCOCCUS AUREUS: NEGATIVE

## 2017-08-29 NOTE — Pre-Procedure Instructions (Signed)
   EDIS HUISH  08/29/2017     [facility] [address] [place] [address] Phone: [phone] Fax: [phone]   Your procedure is scheduled on Tuesday, September 10, 2017  Report to [facility] Admitting at 8:45 A.M.  Call this number if you have problems the morning of surgery:  [phone]   Remember: Follow your surgeon's instructions regarding Aspirin; if no pre-op  instructions were provided, contact your surgeon.  Do not eat or drink after midnight Monday, September 09, 2017  Take these medicines the morning of surgery with A SIP OF WATER : metoprolol succinate (TOPROL-XL), omeprazole (PRILOSEC), Tamsulosin HCl (FLOMAX)  If needed: NITROSTAT for chest pain  Stop taking vitamins, fish oil, Coenzyme Q10 (COQ10), Lycopene, Selenium, zinc gluconate  and herbal medications. Do not take any NSAIDs ie: Ibuprofen, Advil, Naproxen (Aleve), Motrin, BC and Goody Powder; stop 7 days prior to surgery.  Do not wear jewelry, make-up or nail polish.  Do not wear lotions, powders, or perfumes, or deodorant.  Do not shave 48 hours prior to surgery.  Men may shave face and neck.  Do not bring valuables to the hospital.  [facility] is not responsible for any belongings or valuables.  Contacts, dentures or bridgework may not be worn into surgery.  Leave your suitcase in the car.  After surgery it may be brought to your room. Special instructions:Shower the night before and morning of surgery with CHG. Please read over the following fact sheets that you were given. Pain Booklet, Coughing and Deep Breathing, MRSA Information and Surgical Site Infection Prevention

## 2017-08-29 NOTE — Progress Notes (Signed)
Pt stated that a stress test was performed > 10 years ago.

## 2017-08-29 NOTE — Progress Notes (Signed)
Pt denies any acute cardiopulmonary issues. Pt under the care of Dr. Kingsley Plan, Cardiology. Pt to f/u with surgeon regarding pre-op Aspirin  (325mg  ) instructions. Pt stated that he takes Metoprolol at HS. Pt denies having a chest x ray within the last year. Pt denies recent labs. Pt chart forwarded to anesthesia to review cardiology note in Epic.

## 2017-08-30 NOTE — Progress Notes (Addendum)
Anesthesia Chart Review:  Case:  [id number] Date/Time:  09/10/17 1029   Procedure:  TOTAL SHOULDER ARTHROPLASTY (Right )   Anesthesia type:  Choice   Pre-op diagnosis:  DJD RIGHT SHOULDER   Location:  [facility] ROOM 04 / [facility]   Surgeon:  Marchia Bond, MD      DISCUSSION: 84 yo male never smoker for above procedure. Pertinent hx includes HTN, HLD, Arthritis, Complication of anesthesia "slow to wake up", CAD (RCA stent 06/10/09, Promus dilated to 4.54mm with residual mid LAD disease of 70%).  Pt seen by cardiology, Dr. Marlou Porch 08/19/2017. Upcoming surgery discussed. Per his note "He is able to complete greater than 4 METS of activity without any anginal symptoms.  No syncope, no bleeding, no orthopnea, no PND, no heart failure-like symptoms.  No arrhythmias.  He does have a very mild first-degree AV block which is stable.  Try to avoid AV nodal blocking agents.  He has had very mild aortic stenosis previously described on echocardiogram.  Valvular murmur does not appear severe on exam today.  Echocardiogram has been ordered.  He may proceed with surgery at moderate overall cardiovascular risk based upon his age and prior CAD which is stable."  Pt has Echo scheduled for 09/03/2017. Chart left for nurse followup.  Anticipate he can proceed with surgery as planned barring acute status change and result of Echo acceptable.  ADDENDUM 09/03/2017: Pt had echo 09/03/2017 with EF 55-60%, no wall motion abnormalities. Valves normal. See below for full results.  VS: BP (!) 166/77   Pulse 78   Temp 36.4 C   Resp 20   Ht 5\' 8"  (1.727 m)   Wt 152 lb 4.8 oz (69.1 kg)   SpO2 100%   BMI 23.16 kg/m   PROVIDERS: Josetta Huddle, MD is PCP  Candee Furbish, MD is Cardiologist last seen 08/19/2017.    LABS: Labs reviewed: Acceptable for surgery. (all labs ordered are listed, but only abnormal results are displayed)  Labs Reviewed  SURGICAL PCR SCREEN  CBC  BASIC METABOLIC PANEL     IMAGES: CHEST - 2 VIEW  03/19/2011  Comparison: Chest x-ray 07/01/2009.  Findings: Lung volumes are normal.  No consolidative airspace disease.  No pleural effusions.  Pulmonary vasculature is normal. Mild cardiomegaly.  Tortuosity of the thoracic aorta. Atherosclerosis.  IMPRESSION:  1.  No radiographic evidence of acute cardiopulmonary disease. 2.  Mild cardiomegaly. 3.  Atherosclerosis.  EKG: 08/19/2017: Sinus rhythm with first-degree AV block.  Left axis deviation.  Possible anteroseptal infarct, age undetermined.  CV: Echo 09/03/2017: Study Conclusions  - Left ventricle: The cavity size was normal. Systolic function was   normal. The estimated ejection fraction was in the range of 55%   to 60%. Wall motion was normal; there were no regional wall   motion abnormalities. There was an increased relative   contribution of atrial contraction to ventricular filling.   Doppler parameters are consistent with abnormal left ventricular   relaxation (grade 1 diastolic dysfunction). - Aortic valve: Trileaflet; mildly thickened, moderately calcified   leaflets. There was very mild stenosis. There was trivial   regurgitation. Valve area (VTI): 2.21 cm^2. Valve area (Vmax): 2   cm^2. Valve area (Vmean): 2.27 cm^2. - Mitral valve: Calcified annulus. There was trivial regurgitation. - Left atrium: The atrium was mildly dilated. - Right ventricle: The cavity size was mildly dilated. Wall   thickness was normal. - Right atrium: The atrium was mildly dilated. - Pulmonic valve: There was trivial regurgitation.  Echo 06/19/2013: Study Conclusions  - Left ventricle: The cavity size was normal. There was mild focal basal hypertrophy of the septum. Systolic function was normal. The estimated ejection fraction was in the range of 60% to 65%. Wall motion was normal; there were no regional wall motion abnormalities. Doppler parameters are consistent with abnormal left ventricular relaxation (grade 1  diastolic dysfunction). - Aortic valve: There was very mild stenosis. Mild regurgitation. - Mitral valve: Calcified annulus. - Atrial septum: There was an atrial septal aneurysm.  Past Medical History:  Diagnosis Date  . Arthritis   . Cancer [facility])    prostate  . Complication of anesthesia    slow to wake up  . Coronary artery disease   . Heart disease   . History of prostate cancer 06/2009  . Hyperlipidemia   . Hypertension   . Wears glasses     Past Surgical History:  Procedure Laterality Date  . CARDIAC CATHETERIZATION     2011  . CATARACT EXTRACTION, BILATERAL    . COLONOSCOPY    . CORONARY STENT PLACEMENT  06/2009  . HERNIA REPAIR  01/10/11   rih  . INSERTION PROSTATE RADIATION SEED    . TONSILLECTOMY      MEDICATIONS: . Ascorbic Acid (VITAMIN C) 1000 MG tablet  . aspirin 325 MG tablet  . cholecalciferol (VITAMIN D) 1000 UNITS tablet  . Coenzyme Q10 (COQ10) 100 MG CAPS  . fish oil-omega-3 fatty acids 1000 MG capsule  . lisinopril (PRINIVIL,ZESTRIL) 5 MG tablet  . Lycopene 5 MG CAPS  . metoprolol succinate (TOPROL-XL) 25 MG 24 hr tablet  . NITROSTAT 0.4 MG SL tablet  . omeprazole (PRILOSEC) 40 MG capsule  . Potassium 99 MG TABS  . rosuvastatin (CRESTOR) 20 MG tablet  . Selenium 100 MCG CAPS  . Tamsulosin HCl (FLOMAX) 0.4 MG CAPS  . vitamin E 400 UNIT capsule  . zinc gluconate 50 MG tablet   No current facility-administered medications for this encounter.     Wynonia Musty [facility] Short Stay Center/Anesthesiology Phone [phone] 08/30/2017 3:57 PM

## 2017-09-03 ENCOUNTER — Other Ambulatory Visit: Payer: Self-pay

## 2017-09-03 ENCOUNTER — Ambulatory Visit (HOSPITAL_COMMUNITY): Payer: Medicare Other | Attending: Cardiology

## 2017-09-03 DIAGNOSIS — I350 Nonrheumatic aortic (valve) stenosis: Secondary | ICD-10-CM

## 2017-09-03 DIAGNOSIS — E785 Hyperlipidemia, unspecified: Secondary | ICD-10-CM | POA: Insufficient documentation

## 2017-09-03 DIAGNOSIS — I10 Essential (primary) hypertension: Secondary | ICD-10-CM | POA: Diagnosis not present

## 2017-09-03 DIAGNOSIS — I2510 Atherosclerotic heart disease of native coronary artery without angina pectoris: Secondary | ICD-10-CM | POA: Insufficient documentation

## 2017-09-04 ENCOUNTER — Telehealth: Payer: Self-pay | Admitting: *Deleted

## 2017-09-04 NOTE — Telephone Encounter (Signed)
Notes recorded by Jerline Pain, MD on 09/04/2017 at 9:27 AM EDT Overall reassuring echocardiogram, normal pump function. Very mild aortic stenosis noted. We will continue to monitor clinically at this point. Okay to proceed with shoulder surgery. Candee Furbish, MD         [facility] Pre-operative Risk Assessment    Request for surgical clearance:  1. What type of surgery is being performed? Right total shoulder replacement.  2. When is this surgery scheduled? pending  3. What type of clearance is required (medical clearance vs. Pharmacy clearance to hold med vs. Both)?  Medical  4. Are there any medications that need to be held prior to surgery and how long? N/A  5. Practice name and name of physician performing surgery? [facility] - Dr Griffin Basil  6. What is your office phone number [phone] ext [phone] Sherri   7.   What is your office fax number [phone]  ATTN - Sherri  8.   Anesthesia type (None, local, MAC, general) ? Not listed   Pam, RN for Dr Marlou Porch 09/04/2017, 10:17 AM  _________________________________________________________________   (provider comments below)

## 2017-09-09 MED ORDER — ACETAMINOPHEN 500 MG PO TABS
1000.0000 mg | ORAL_TABLET | Freq: Once | ORAL | Status: AC
  Administered 2017-09-10: 1000 mg via ORAL
  Filled 2017-09-09: qty 2

## 2017-09-09 MED ORDER — CEFAZOLIN SODIUM-DEXTROSE 2-4 GM/100ML-% IV SOLN
2.0000 g | INTRAVENOUS | Status: AC
  Administered 2017-09-10: 2 g via INTRAVENOUS
  Filled 2017-09-09: qty 100

## 2017-09-09 MED ORDER — GABAPENTIN 300 MG PO CAPS
300.0000 mg | ORAL_CAPSULE | ORAL | Status: AC
  Administered 2017-09-10: 300 mg via ORAL
  Filled 2017-09-09: qty 1

## 2017-09-09 MED ORDER — DEXAMETHASONE SODIUM PHOSPHATE 10 MG/ML IJ SOLN
8.0000 mg | Freq: Once | INTRAMUSCULAR | Status: AC
  Administered 2017-09-10: 8 mg via INTRAVENOUS
  Filled 2017-09-09: qty 1

## 2017-09-10 ENCOUNTER — Encounter (HOSPITAL_COMMUNITY): Payer: Self-pay

## 2017-09-10 ENCOUNTER — Inpatient Hospital Stay (HOSPITAL_COMMUNITY)
Admission: RE | Admit: 2017-09-10 | Discharge: 2017-09-11 | DRG: 483 | Disposition: A | Discharge status: Home / Self Care | Payer: Medicare Other | Attending: Orthopedic Surgery | Admitting: Orthopedic Surgery

## 2017-09-10 ENCOUNTER — Inpatient Hospital Stay (HOSPITAL_COMMUNITY): Payer: Medicare Other

## 2017-09-10 ENCOUNTER — Inpatient Hospital Stay (HOSPITAL_COMMUNITY): Payer: Medicare Other | Admitting: Anesthesiology

## 2017-09-10 ENCOUNTER — Encounter (HOSPITAL_COMMUNITY)
Admission: RE | Disposition: A | Discharge status: Home / Self Care | Payer: Self-pay | Source: Home / Self Care | Attending: Orthopedic Surgery

## 2017-09-10 ENCOUNTER — Inpatient Hospital Stay (HOSPITAL_COMMUNITY): Payer: Medicare Other | Admitting: Physician Assistant

## 2017-09-10 DIAGNOSIS — I10 Essential (primary) hypertension: Secondary | ICD-10-CM | POA: Diagnosis present

## 2017-09-10 DIAGNOSIS — Z9841 Cataract extraction status, right eye: Secondary | ICD-10-CM | POA: Diagnosis not present

## 2017-09-10 DIAGNOSIS — I2510 Atherosclerotic heart disease of native coronary artery without angina pectoris: Secondary | ICD-10-CM | POA: Diagnosis present

## 2017-09-10 DIAGNOSIS — Z8546 Personal history of malignant neoplasm of prostate: Secondary | ICD-10-CM | POA: Diagnosis not present

## 2017-09-10 DIAGNOSIS — G8918 Other acute postprocedural pain: Secondary | ICD-10-CM | POA: Diagnosis not present

## 2017-09-10 DIAGNOSIS — Z7982 Long term (current) use of aspirin: Secondary | ICD-10-CM

## 2017-09-10 DIAGNOSIS — Z471 Aftercare following joint replacement surgery: Secondary | ICD-10-CM | POA: Diagnosis not present

## 2017-09-10 DIAGNOSIS — E785 Hyperlipidemia, unspecified: Secondary | ICD-10-CM | POA: Diagnosis present

## 2017-09-10 DIAGNOSIS — Z955 Presence of coronary angioplasty implant and graft: Secondary | ICD-10-CM | POA: Diagnosis not present

## 2017-09-10 DIAGNOSIS — Z96611 Presence of right artificial shoulder joint: Secondary | ICD-10-CM | POA: Diagnosis not present

## 2017-09-10 DIAGNOSIS — Z9842 Cataract extraction status, left eye: Secondary | ICD-10-CM | POA: Diagnosis not present

## 2017-09-10 DIAGNOSIS — M19019 Primary osteoarthritis, unspecified shoulder: Secondary | ICD-10-CM | POA: Diagnosis present

## 2017-09-10 DIAGNOSIS — Z96619 Presence of unspecified artificial shoulder joint: Secondary | ICD-10-CM

## 2017-09-10 DIAGNOSIS — M19011 Primary osteoarthritis, right shoulder: Secondary | ICD-10-CM | POA: Diagnosis present

## 2017-09-10 HISTORY — PX: TOTAL SHOULDER ARTHROPLASTY: SHX126

## 2017-09-10 HISTORY — DX: Primary osteoarthritis, right shoulder: M19.011

## 2017-09-10 SURGERY — ARTHROPLASTY, SHOULDER, TOTAL
Anesthesia: General | Site: Shoulder | Laterality: Right

## 2017-09-10 MED ORDER — NITROGLYCERIN 0.4 MG SL SUBL: 0.4000 mg | SUBLINGUAL_TABLET | SUBLINGUAL | Status: DC | PRN

## 2017-09-10 MED ORDER — OMEGA-3 FATTY ACIDS 1000 MG PO CAPS: 1.0000 g | ORAL_CAPSULE | Freq: Every day | ORAL | Status: DC

## 2017-09-10 MED ORDER — SUGAMMADEX SODIUM 200 MG/2ML IV SOLN
INTRAVENOUS | Status: DC | PRN
  Administered 2017-09-10: 200 mg via INTRAVENOUS

## 2017-09-10 MED ORDER — POTASSIUM CHLORIDE IN NACL 20-0.45 MEQ/L-% IV SOLN
INTRAVENOUS | Status: DC
  Filled 2017-09-10: qty 1000

## 2017-09-10 MED ORDER — COQ10 100 MG PO CAPS: 1.0000 | ORAL_CAPSULE | Freq: Every day | ORAL | Status: DC

## 2017-09-10 MED ORDER — SELENIUM 100 MCG PO CAPS
1.0000 | ORAL_CAPSULE | ORAL | Status: DC
Start: 2017-09-11 — End: 2017-09-10

## 2017-09-10 MED ORDER — MENTHOL 3 MG MT LOZG: 1.0000 | LOZENGE | OROMUCOSAL | Status: DC | PRN

## 2017-09-10 MED ORDER — ALUM & MAG HYDROXIDE-SIMETH 200-200-20 MG/5ML PO SUSP: 30.0000 mL | ORAL | Status: DC | PRN

## 2017-09-10 MED ORDER — LISINOPRIL 10 MG PO TABS
5.0000 mg | ORAL_TABLET | Freq: Every day | ORAL | Status: DC
  Administered 2017-09-10: 5 mg via ORAL

## 2017-09-10 MED ORDER — LYCOPENE 5 MG PO CAPS: 1.0000 | ORAL_CAPSULE | ORAL | Status: DC

## 2017-09-10 MED ORDER — FENTANYL CITRATE (PF) 100 MCG/2ML IJ SOLN
100.0000 ug | Freq: Once | INTRAMUSCULAR | Status: AC
  Administered 2017-09-10: 50 ug via INTRAVENOUS
  Filled 2017-09-10: qty 2

## 2017-09-10 MED ORDER — VITAMIN E 180 MG (400 UNIT) PO CAPS: 400.0000 [IU] | ORAL_CAPSULE | Freq: Every day | ORAL | Status: DC

## 2017-09-10 MED ORDER — PHENOL 1.4 % MT LIQD: 1.0000 | OROMUCOSAL | Status: DC | PRN

## 2017-09-10 MED ORDER — MIDAZOLAM HCL 2 MG/2ML IJ SOLN
2.0000 mg | Freq: Once | INTRAMUSCULAR | Status: AC
  Administered 2017-09-10: 2 mg via INTRAVENOUS
  Filled 2017-09-10: qty 2

## 2017-09-10 MED ORDER — POTASSIUM 99 MG PO TABS: 1.0000 | ORAL_TABLET | Freq: Every day | ORAL | Status: DC

## 2017-09-10 MED ORDER — ZOLPIDEM TARTRATE 5 MG PO TABS: 5.0000 mg | ORAL_TABLET | Freq: Every evening | ORAL | Status: DC | PRN

## 2017-09-10 MED ORDER — CEFAZOLIN SODIUM-DEXTROSE 1-4 GM/50ML-% IV SOLN
1.0000 g | Freq: Four times a day (QID) | INTRAVENOUS | Status: AC
  Administered 2017-09-10 – 2017-09-11 (×3): 1 g via INTRAVENOUS
  Filled 2017-09-10 (×3): qty 50

## 2017-09-10 MED ORDER — PHENYLEPHRINE 40 MCG/ML (10ML) SYRINGE FOR IV PUSH (FOR BLOOD PRESSURE SUPPORT)
PREFILLED_SYRINGE | INTRAVENOUS | Status: AC
  Filled 2017-09-10: qty 10

## 2017-09-10 MED ORDER — DOCUSATE SODIUM 100 MG PO CAPS
100.0000 mg | ORAL_CAPSULE | Freq: Two times a day (BID) | ORAL | Status: DC
  Administered 2017-09-10: 100 mg via ORAL
  Filled 2017-09-10: qty 1

## 2017-09-10 MED ORDER — TRAMADOL HCL 50 MG PO TABS: 50.0000 mg | ORAL_TABLET | Freq: Four times a day (QID) | ORAL | 0 refills | Status: AC | PRN

## 2017-09-10 MED ORDER — PROPOFOL 10 MG/ML IV BOLUS
INTRAVENOUS | Status: DC | PRN
  Administered 2017-09-10: 100 mg via INTRAVENOUS

## 2017-09-10 MED ORDER — ROCURONIUM BROMIDE 100 MG/10ML IV SOLN
INTRAVENOUS | Status: DC | PRN
  Administered 2017-09-10: 50 mg via INTRAVENOUS

## 2017-09-10 MED ORDER — MORPHINE SULFATE (PF) 2 MG/ML IV SOLN: 0.5000 mg | INTRAVENOUS | Status: DC | PRN

## 2017-09-10 MED ORDER — ACETAMINOPHEN 500 MG PO TABS
500.0000 mg | ORAL_TABLET | Freq: Four times a day (QID) | ORAL | Status: DC
  Administered 2017-09-10 – 2017-09-11 (×3): 500 mg via ORAL
  Filled 2017-09-10 (×3): qty 1

## 2017-09-10 MED ORDER — VITAMIN D 1000 UNITS PO TABS: 1000.0000 [IU] | ORAL_TABLET | Freq: Every day | ORAL | Status: DC

## 2017-09-10 MED ORDER — HYDROCODONE-ACETAMINOPHEN 5-325 MG PO TABS
1.0000 | ORAL_TABLET | ORAL | Status: DC | PRN
  Administered 2017-09-10 – 2017-09-11 (×2): 1 via ORAL
  Filled 2017-09-10 (×2): qty 1

## 2017-09-10 MED ORDER — PANTOPRAZOLE SODIUM 40 MG PO TBEC
80.0000 mg | DELAYED_RELEASE_TABLET | Freq: Every day | ORAL | Status: DC
  Administered 2017-09-10: 80 mg via ORAL
  Filled 2017-09-10: qty 2

## 2017-09-10 MED ORDER — ONDANSETRON HCL 4 MG PO TABS: 4.0000 mg | ORAL_TABLET | Freq: Four times a day (QID) | ORAL | Status: DC | PRN

## 2017-09-10 MED ORDER — BUPIVACAINE LIPOSOME 1.3 % IJ SUSP
INTRAMUSCULAR | Status: DC | PRN
  Administered 2017-09-10: 10 mL via PERINEURAL

## 2017-09-10 MED ORDER — ONDANSETRON HCL 4 MG/2ML IJ SOLN
INTRAMUSCULAR | Status: DC | PRN
  Administered 2017-09-10: 4 mg via INTRAVENOUS

## 2017-09-10 MED ORDER — MEPERIDINE HCL 50 MG/ML IJ SOLN: 6.2500 mg | INTRAMUSCULAR | Status: DC | PRN

## 2017-09-10 MED ORDER — ONDANSETRON HCL 4 MG/2ML IJ SOLN: 4.0000 mg | Freq: Four times a day (QID) | INTRAMUSCULAR | Status: DC | PRN

## 2017-09-10 MED ORDER — METHOCARBAMOL 1000 MG/10ML IJ SOLN
500.0000 mg | Freq: Four times a day (QID) | INTRAVENOUS | Status: DC | PRN
  Filled 2017-09-10: qty 5

## 2017-09-10 MED ORDER — SODIUM CHLORIDE 0.9 % IV SOLN
INTRAVENOUS | Status: DC | PRN
  Administered 2017-09-10: 50 ug/min via INTRAVENOUS

## 2017-09-10 MED ORDER — KETOROLAC TROMETHAMINE 15 MG/ML IJ SOLN
7.5000 mg | Freq: Four times a day (QID) | INTRAMUSCULAR | Status: AC
  Administered 2017-09-10 – 2017-09-11 (×4): 7.5 mg via INTRAVENOUS
  Filled 2017-09-10 (×4): qty 1

## 2017-09-10 MED ORDER — LACTATED RINGERS IV SOLN
INTRAVENOUS | Status: DC | PRN
  Administered 2017-09-10: 09:00:00 via INTRAVENOUS

## 2017-09-10 MED ORDER — SODIUM CHLORIDE 0.9 % IR SOLN
Status: DC | PRN
  Administered 2017-09-10: 3000 mL

## 2017-09-10 MED ORDER — ONDANSETRON HCL 4 MG/2ML IJ SOLN: 4.0000 mg | Freq: Once | INTRAMUSCULAR | Status: DC | PRN

## 2017-09-10 MED ORDER — HYDROMORPHONE HCL 1 MG/ML IJ SOLN: 0.2500 mg | INTRAMUSCULAR | Status: DC | PRN

## 2017-09-10 MED ORDER — 0.9 % SODIUM CHLORIDE (POUR BTL) OPTIME
TOPICAL | Status: DC | PRN
  Administered 2017-09-10: 1000 mL

## 2017-09-10 MED ORDER — DEXAMETHASONE SODIUM PHOSPHATE 10 MG/ML IJ SOLN
INTRAMUSCULAR | Status: AC
  Filled 2017-09-10: qty 1

## 2017-09-10 MED ORDER — HYDROCODONE-ACETAMINOPHEN 7.5-325 MG PO TABS: 1.0000 | ORAL_TABLET | ORAL | Status: DC | PRN

## 2017-09-10 MED ORDER — FENTANYL CITRATE (PF) 250 MCG/5ML IJ SOLN
INTRAMUSCULAR | Status: AC
  Filled 2017-09-10: qty 5

## 2017-09-10 MED ORDER — ACETAMINOPHEN 325 MG PO TABS: 325.0000 mg | ORAL_TABLET | Freq: Four times a day (QID) | ORAL | Status: DC | PRN

## 2017-09-10 MED ORDER — METHOCARBAMOL 500 MG PO TABS
500.0000 mg | ORAL_TABLET | Freq: Four times a day (QID) | ORAL | Status: DC | PRN
Start: 2017-09-10 — End: 2017-09-11
  Administered 2017-09-10: 500 mg via ORAL
  Filled 2017-09-10: qty 1

## 2017-09-10 MED ORDER — ROCURONIUM BROMIDE 10 MG/ML (PF) SYRINGE
PREFILLED_SYRINGE | INTRAVENOUS | Status: AC
  Filled 2017-09-10: qty 10

## 2017-09-10 MED ORDER — CHLORHEXIDINE GLUCONATE 4 % EX LIQD: 60.0000 mL | Freq: Once | CUTANEOUS | Status: DC

## 2017-09-10 MED ORDER — BISACODYL 10 MG RE SUPP: 10.0000 mg | Freq: Every day | RECTAL | Status: DC | PRN

## 2017-09-10 MED ORDER — PROPOFOL 10 MG/ML IV BOLUS
INTRAVENOUS | Status: AC
  Filled 2017-09-10: qty 20

## 2017-09-10 MED ORDER — MAGNESIUM CITRATE PO SOLN: 1.0000 | Freq: Once | ORAL | Status: DC | PRN

## 2017-09-10 MED ORDER — BUPIVACAINE-EPINEPHRINE (PF) 0.5% -1:200000 IJ SOLN
INTRAMUSCULAR | Status: DC | PRN
  Administered 2017-09-10: 20 mL via PERINEURAL

## 2017-09-10 MED ORDER — METOCLOPRAMIDE HCL 5 MG/ML IJ SOLN: 5.0000 mg | Freq: Three times a day (TID) | INTRAMUSCULAR | Status: DC | PRN

## 2017-09-10 MED ORDER — VITAMIN C 500 MG PO TABS: 1000.0000 mg | ORAL_TABLET | Freq: Every day | ORAL | Status: DC

## 2017-09-10 MED ORDER — TAMSULOSIN HCL 0.4 MG PO CAPS
0.4000 mg | ORAL_CAPSULE | Freq: Every day | ORAL | Status: DC
  Administered 2017-09-10: 0.4 mg via ORAL
  Filled 2017-09-10: qty 1

## 2017-09-10 MED ORDER — METOCLOPRAMIDE HCL 5 MG PO TABS: 5.0000 mg | ORAL_TABLET | Freq: Three times a day (TID) | ORAL | Status: DC | PRN

## 2017-09-10 MED ORDER — ONDANSETRON HCL 4 MG/2ML IJ SOLN
INTRAMUSCULAR | Status: AC
  Filled 2017-09-10: qty 2

## 2017-09-10 MED ORDER — LIDOCAINE 2% (20 MG/ML) 5 ML SYRINGE
INTRAMUSCULAR | Status: AC
  Filled 2017-09-10: qty 5

## 2017-09-10 MED ORDER — ASPIRIN 325 MG PO TABS
325.0000 mg | ORAL_TABLET | Freq: Every day | ORAL | Status: DC
  Filled 2017-09-10: qty 1

## 2017-09-10 MED ORDER — ROSUVASTATIN CALCIUM 20 MG PO TABS
20.0000 mg | ORAL_TABLET | ORAL | Status: DC
  Administered 2017-09-10: 20 mg via ORAL
  Filled 2017-09-10: qty 1

## 2017-09-10 MED ORDER — DIPHENHYDRAMINE HCL 12.5 MG/5ML PO ELIX: 12.5000 mg | ORAL_SOLUTION | ORAL | Status: DC | PRN

## 2017-09-10 MED ORDER — TRAMADOL HCL 50 MG PO TABS: 50.0000 mg | ORAL_TABLET | Freq: Four times a day (QID) | ORAL | Status: DC | PRN

## 2017-09-10 MED ORDER — ZINC GLUCONATE 50 MG PO TABS: 50.0000 mg | ORAL_TABLET | Freq: Every day | ORAL | Status: DC

## 2017-09-10 MED ORDER — LIDOCAINE HCL (CARDIAC) PF 100 MG/5ML IV SOSY
PREFILLED_SYRINGE | INTRAVENOUS | Status: DC | PRN
  Administered 2017-09-10: 100 mg via INTRAVENOUS

## 2017-09-10 MED ORDER — METOPROLOL SUCCINATE ER 25 MG PO TB24: 25.0000 mg | ORAL_TABLET | Freq: Every day | ORAL | Status: DC

## 2017-09-10 MED ORDER — POLYETHYLENE GLYCOL 3350 17 G PO PACK: 17.0000 g | PACK | Freq: Every day | ORAL | Status: DC | PRN

## 2017-09-10 SURGICAL SUPPLY — 61 items
ADPR HD STD TPR HUM TI RVRS (Orthopedic Implant) ×1 IMPLANT
BIT DRILL 5/64X5 DISP (BIT) ×3 IMPLANT
BIT DRILL 7/64X5 DISP (BIT) IMPLANT
BLADE SAW SGTL MED 73X18.5 STR (BLADE) ×3 IMPLANT
CEMENT BONE R 1X40 (Cement) ×3 IMPLANT
CLOSURE STERI-STRIP 1/2X4 (GAUZE/BANDAGES/DRESSINGS) ×1
CLSR STERI-STRIP ANTIMIC 1/2X4 (GAUZE/BANDAGES/DRESSINGS) ×2 IMPLANT
COVER SURGICAL LIGHT HANDLE (MISCELLANEOUS) ×3 IMPLANT
DRAPE HALF SHEET 40X57 (DRAPES) ×3 IMPLANT
DRAPE ORTHO SPLIT 77X108 STRL (DRAPES) ×6
DRAPE SURG ORHT 6 SPLT 77X108 (DRAPES) ×2 IMPLANT
DRAPE U-SHAPE 47X51 STRL (DRAPES) ×3 IMPLANT
DRSG MEPILEX BORDER 4X8 (GAUZE/BANDAGES/DRESSINGS) ×3 IMPLANT
DRSG MEPITEL 4X7.2 (GAUZE/BANDAGES/DRESSINGS) ×2 IMPLANT
DURAPREP 26ML APPLICATOR (WOUND CARE) ×3 IMPLANT
ELECT REM PT RETURN 9FT ADLT (ELECTROSURGICAL) ×3
ELECTRODE REM PT RTRN 9FT ADLT (ELECTROSURGICAL) ×1 IMPLANT
GLENOID PEGGED STRL MED 4MM (Orthopedic Implant) ×2 IMPLANT
GLENOID POST REGENREX HYBRID (Orthopedic Implant) ×2 IMPLANT
GLOVE BIOGEL PI ORTHO PRO SZ8 (GLOVE) ×4
GLOVE ORTHO TXT STRL SZ7.5 (GLOVE) ×3 IMPLANT
GLOVE PI ORTHO PRO STRL SZ8 (GLOVE) ×2 IMPLANT
GLOVE SURG ORTHO 8.0 STRL STRW (GLOVE) ×3 IMPLANT
GOWN STRL REUS W/ TWL XL LVL3 (GOWN DISPOSABLE) ×1 IMPLANT
GOWN STRL REUS W/TWL 2XL LVL3 (GOWN DISPOSABLE) ×3 IMPLANT
GOWN STRL REUS W/TWL XL LVL3 (GOWN DISPOSABLE) ×3
HANDPIECE INTERPULSE COAX TIP (DISPOSABLE) ×3
HEAD HUMERAL COMP STD (Orthopedic Implant) ×1 IMPLANT
HEAD MODULAR W VARIABLE OFFSET (Orthopedic Implant) IMPLANT
HOOD PEEL AWAY FACE SHEILD DIS (HOOD) ×3 IMPLANT
HOOD PEEL AWAY FLYTE STAYCOOL (MISCELLANEOUS) ×3 IMPLANT
HUMERAL HEAD COMP STD (Orthopedic Implant) ×3 IMPLANT
KIT BASIN OR (CUSTOM PROCEDURE TRAY) ×3 IMPLANT
KIT TURNOVER KIT B (KITS) ×3 IMPLANT
MANIFOLD NEPTUNE II (INSTRUMENTS) ×3 IMPLANT
MODULAR HEAD W VARIABLE OFFSET (Orthopedic Implant) ×3 IMPLANT
NS IRRIG 1000ML POUR BTL (IV SOLUTION) ×3 IMPLANT
PACK SHOULDER (CUSTOM PROCEDURE TRAY) ×3 IMPLANT
PAD ARMBOARD 7.5X6 YLW CONV (MISCELLANEOUS) ×6 IMPLANT
PIN HUMERAL STMN 3.2MMX9IN (INSTRUMENTS) ×3 IMPLANT
SET HNDPC FAN SPRY TIP SCT (DISPOSABLE) ×1 IMPLANT
SLING ARM IMMOBILIZER LRG (SOFTGOODS) ×5 IMPLANT
SLING ARM IMMOBILIZER MED (SOFTGOODS) IMPLANT
SMARTMIX MINI TOWER (MISCELLANEOUS) ×3
SPONGE LAP 18X18 X RAY DECT (DISPOSABLE) ×4 IMPLANT
STEM HUMERAL STRL 14MMX140MM (Stem) ×3 IMPLANT
SUCTION FRAZIER HANDLE 10FR (MISCELLANEOUS) ×2
SUCTION TUBE FRAZIER 10FR DISP (MISCELLANEOUS) ×1 IMPLANT
SUPPORT WRAP ARM LG (MISCELLANEOUS) ×3 IMPLANT
SUT FIBERWIRE #2 38 REV NDL BL (SUTURE) ×18
SUT VIC AB 0 CT1 27 (SUTURE)
SUT VIC AB 0 CT1 27XBRD ANBCTR (SUTURE) ×1 IMPLANT
SUT VIC AB 2-0 CT1 27 (SUTURE) ×3
SUT VIC AB 2-0 CT1 TAPERPNT 27 (SUTURE) ×1 IMPLANT
SUT VIC AB 3-0 SH 8-18 (SUTURE) ×3 IMPLANT
SUTURE FIBERWR#2 38 REV NDL BL (SUTURE) ×6 IMPLANT
TOWEL OR 17X26 10 PK STRL BLUE (TOWEL DISPOSABLE) ×3 IMPLANT
TOWER SMARTMIX MINI (MISCELLANEOUS) ×1 IMPLANT
TUBE CONNECTING 12'X1/4 (SUCTIONS)
TUBE CONNECTING 12X1/4 (SUCTIONS) IMPLANT
YANKAUER SUCT BULB TIP NO VENT (SUCTIONS) IMPLANT

## 2017-09-10 NOTE — Anesthesia Preprocedure Evaluation (Addendum)
Anesthesia Evaluation  Patient identified by MRN, date of birth, ID band Patient awake    Reviewed: Allergy & Precautions, NPO status , Patient's Chart, lab work & pertinent test results  Airway Mallampati: I  TM Distance: >3 FB Neck ROM: Full    Dental  (+) Teeth Intact, Dental Advisory Given   Pulmonary    Pulmonary exam normal        Cardiovascular hypertension, Pt. on medications + CAD and + Cardiac Stents  Normal cardiovascular exam  ECHO 7/19 Study Conclusions  - Left ventricle: The cavity size was normal. Systolic function was   normal. The estimated ejection fraction was in the range of 55%   to 60%. Wall motion was normal; there were no regional wall   motion abnormalities. There was an increased relative   contribution of atrial contraction to ventricular filling.   Doppler parameters are consistent with abnormal left ventricular   relaxation (grade 1 diastolic dysfunction). - Aortic valve: Trileaflet; mildly thickened, moderately calcified   leaflets. There was very mild stenosis. There was trivial   regurgitation. Valve area (VTI): 2.21 cm^2. Valve area (Vmax): 2   cm^2. Valve area (Vmean): 2.27 cm^2. - Mitral valve: Calcified annulus. There was trivial regurgitation. - Left atrium: The atrium was mildly dilated. - Right ventricle: The cavity size was mildly dilated. Wall   thickness was normal. - Right atrium: The atrium was mildly dilated. - Pulmonic valve: There was trivial regurgitation.   Neuro/Psych    GI/Hepatic   Endo/Other    Renal/GU      Musculoskeletal   Abdominal   Peds  Hematology   Anesthesia Other Findings   Reproductive/Obstetrics                          Anesthesia Physical Anesthesia Plan  ASA: III  Anesthesia Plan: General   Post-op Pain Management:  Regional for Post-op pain   Induction: Intravenous  PONV Risk Score and Plan: 2 and  Ondansetron and Treatment may vary due to age or medical condition  Airway Management Planned: Oral ETT  Additional Equipment:   Intra-op Plan:   Post-operative Plan: Extubation in OR  Informed Consent: I have reviewed the patients History and Physical, chart, labs and discussed the procedure including the risks, benefits and alternatives for the proposed anesthesia with the patient or authorized representative who has indicated his/her understanding and acceptance.     Plan Discussed with: CRNA and Surgeon  Anesthesia Plan Comments:         Anesthesia Quick Evaluation

## 2017-09-10 NOTE — Op Note (Signed)
09/10/2017  12:20 PM  PATIENT:  Gary Armstrong    PRE-OPERATIVE DIAGNOSIS:  Right shoulder osteoarthritis  POST-OPERATIVE DIAGNOSIS:  Same  PROCEDURE: Right total Shoulder Arthroplasty  SURGEON:  Johnny Bridge, MD  PHYSICIAN ASSISTANT: Joya Gaskins, OPA-C, present and scrubbed throughout the case, critical for completion in a timely fashion, and for retraction, instrumentation, and closure.  ANESTHESIA:   General with an interscalene block  ESTIMATED BLOOD LOSS: 150 mL  UNIQUE ASPECTS OF THE CASE: There was significant biconcavity of the glenoid, however I was able to ream down the anterior side, and in fact I had all 3 pegs and the central regenerex post still contained within the vault.  PREOPERATIVE INDICATIONS:  [name] is a  84 y.o. male who failed conservative measures and elected for surgical management.    The risks benefits and alternatives were discussed with the patient preoperatively including but not limited to the risks of infection, bleeding, nerve injury, cardiopulmonary complications, the need for revision surgery, dislocation, loosening, incomplete relief of pain, among others, and the patient was willing to proceed.   OPERATIVE IMPLANTS: Biomet size 14 micro press-fit humeral stem, size 46+18 Versa-dial humeral head, set in the E position with increased coverage posteriorly, with a medium cemented glenoid polyethylene 3 peg implant with a central regenerex noncemented post.   OPERATIVE FINDINGS: Advanced glenohumeral osteoarthritis involving the glenoid and the humeral head with substantial osteophyte formation inferiorly.  Significant biconcavity.     OPERATIVE PROCEDURE: The patient was brought to the operating room and placed in the supine position. General anesthesia was administered. IV antibiotics were given.  The upper extremity was prepped and draped in usual sterile fashion. The patient was in a beachchair position with all bony prominences padded.    Time out was performed and a deltopectoral approach was carried out. The biceps tendon was tenodesed to the pectoralis tendon. The subscapularis was released, tagging it with a #2 FiberWire, leaving a cuff of tendon for repair.   The inferior osteophyte was removed, and release of the capsule off of the humeral side was completed. The head was dislocated, and I reamed sequentially. I placed the humeral cutting guide at 30 of retroversion, and then pinned this into place, and made my humeral neck cut. This was at the appropriate level.   I then placed deep retractors and exposed the glenoid. I excised the labrum circumferentially, taking care to protect the axillary nerve inferiorly.   I then placed a guidewire into the center position, controlling appropriate version and inclination. I then reamed over the guidewire with the small reamer, and was satisfied with the preparation. I preserved the subchondral bone in order to maximize the strength and minimize the risk for subsequent subsidence.    I then drilled the central hole for the regenerex peg, and then placed the guide, and then drilled the 3 peripheral peg holes. I had excellent bony circumferential contact.  I did ream down primarily anteriorly in order to achieve good fixation centrally, and I was surprised and happy with the degree of bony support that I was able to achieve.  I then cleaned the glenoid, irrigated it copiously, and then dried it and cemented the prosthesis into place. Excellent seating was achieved. I had full exposure. The cement cured while I turned my attention to the humeral side.   I sequentially broached, up to the selected size, with the broach set at 30 of retroversion. I placed 3 #2 Fiberwire through the bone  for subsequent repair.  I then placed the real stem. I trialed with multiple heads, and the above-named component was selected. Increased posterior coverage improved the coverage. The soft tissue tension was  appropriate.   I then impacted the real humeral head into place, reduced the head, and irrigated copiously. Excellent stability and range of motion was achieved. I repaired the subscapularis with a total of 5 #2 FiberWire; one for the interval, one for the corner, and then the remaining three from the lesser tuberosity which had already been passed.  Excellent repair achieved and I irrigated copiously once more. The subcutaneous tissue was closed with Vicryl including the deltopectoral fascia.   The skin was closed with Steri-Strips and sterile gauze was applied. He had a preoperative nerve block. He tolerated the procedure well and there were no complications.

## 2017-09-10 NOTE — Anesthesia Procedure Notes (Signed)
Procedure Name: Intubation Date/Time: 09/10/2017 10:39 AM Performed by: Kyung Rudd, CRNA Pre-anesthesia Checklist: Patient identified, Emergency Drugs available, Suction available, Patient being monitored and Timeout performed Patient Re-evaluated:Patient Re-evaluated prior to induction Oxygen Delivery Method: Circle system utilized Preoxygenation: Pre-oxygenation with 100% oxygen Induction Type: IV induction Ventilation: Mask ventilation without difficulty Laryngoscope Size: Mac and 3 Grade View: Grade I Tube type: Oral Tube size: 7.5 mm Number of attempts: 1 Airway Equipment and Method: Stylet Placement Confirmation: ETT inserted through vocal cords under direct vision,  positive ETCO2 and breath sounds checked- equal and bilateral Secured at: 21 cm Tube secured with: Tape Dental Injury: Teeth and Oropharynx as per pre-operative assessment

## 2017-09-10 NOTE — Progress Notes (Signed)
Orthopedic Tech Progress Note Patient Details:  Gary Armstrong 1929-06-12 [id number]  Ortho Devices Type of Ortho Device: Shoulder immobilizer   Post Interventions Patient Tolerated: Well Instructions Provided: Care of device   [name] 09/10/2017, 3:51 PM

## 2017-09-10 NOTE — Anesthesia Procedure Notes (Signed)
Anesthesia Regional Block: Interscalene brachial plexus block   Pre-Anesthetic Checklist: ,, timeout performed, Correct Patient, Correct Site, Correct Laterality, Correct Procedure, Correct Position, site marked, Risks and benefits discussed,  Surgical consent,  Pre-op evaluation,  At surgeon's request and post-op pain management  Laterality: Right  Prep: chloraprep       Needles:  Injection technique: Single-shot  Needle Type: Echogenic Stimulator Needle     Needle Length: 5cm  Needle Gauge: 21     Additional Needles:   Procedures:, nerve stimulator,,,,,,,   Nerve Stimulator or Paresthesia:  Response: 0.4 mA,   Additional Responses:   Narrative:  Start time: 09/10/2017 9:28 AM End time: 09/10/2017 9:38 AM Injection made incrementally with aspirations every 5 mL.  Performed by: Personally  Anesthesiologist: Lillia Abed, MD  Additional Notes: Monitors applied. Patient sedated. Sterile prep and drape,hand hygiene and sterile gloves were used. Relevant anatomy identified.Needle position confirmed.Local anesthetic injected incrementally after negative aspiration. Local anesthetic spread visualized around nerve(s). Vascular puncture avoided. No complications. Image printed for medical record.The patient tolerated the procedure well.

## 2017-09-10 NOTE — Transfer of Care (Signed)
Immediate Anesthesia Transfer of Care Note  Patient: Gary Armstrong  Procedure(s) Performed: RIGHT TOTAL SHOULDER ARTHROPLASTY (Right Shoulder)  Patient Location: PACU  Anesthesia Type:GA combined with regional for post-op pain  Level of Consciousness: awake, alert  and oriented  Airway & Oxygen Therapy: Patient Spontanous Breathing and Patient connected to nasal cannula oxygen  Post-op Assessment: Report given to RN and Post -op Vital signs reviewed and stable  Post vital signs: Reviewed and stable  Last Vitals:  Vitals Value Taken Time  BP    Temp    Pulse 62 09/10/2017 12:53 PM  Resp 9 09/10/2017 12:53 PM  SpO2 97 % 09/10/2017 12:53 PM  Vitals shown include unvalidated device data.  Last Pain:  Vitals:   09/10/17 0859  TempSrc: Oral  PainSc: 0-No pain      Patients Stated Pain Goal: 3 (48/35/07 5732)  Complications: No apparent anesthesia complications

## 2017-09-10 NOTE — Discharge Instructions (Signed)

## 2017-09-10 NOTE — H&P (Signed)
PREOPERATIVE H&P  Chief Complaint: right shoulder pain  HPI: Gary Armstrong is a 84 y.o. male who presents for preoperative history and physical with a diagnosis of right shoulder osteoarthritis. Symptoms are rated as moderate to severe, and have been worsening.  This is significantly impairing activities of daily living.  He has elected for surgical management.   He has failed injections, activity modification, anti-inflammatories, and assistive devices.  Preoperative X-rays demonstrate end stage degenerative changes with osteophyte formation, loss of joint space, subchondral sclerosis.   Past Medical History:  Diagnosis Date  . Arthritis   . Cancer [facility] [address])    prostate  . Complication of anesthesia    slow to wake up  . Coronary artery disease   . Heart disease   . History of prostate cancer 06/2009  . Hyperlipidemia   . Hypertension   . Wears glasses    Past Surgical History:  Procedure Laterality Date  . CARDIAC CATHETERIZATION     2011  . CATARACT EXTRACTION, BILATERAL    . COLONOSCOPY    . CORONARY STENT PLACEMENT  06/2009  . HERNIA REPAIR  01/10/11   rih  . INSERTION PROSTATE RADIATION SEED    . TONSILLECTOMY     Social History   Socioeconomic History  . Marital status: Married    Spouse name: Not on file  . Number of children: Not on file  . Years of education: Not on file  . Highest education level: Not on file  Occupational History  . Not on file  Social Needs  . Financial resource strain: Not on file  . Food insecurity:    Worry: Not on file    Inability: Not on file  . Transportation needs:    Medical: Not on file    Non-medical: Not on file  Tobacco Use  . Smoking status: Never Smoker  . Smokeless tobacco: Never Used  Substance and Sexual Activity  . Alcohol use: Yes    Comment: very little  . Drug use: No  . Sexual activity: Not on file  Lifestyle  . Physical activity:    Days per week: Not on file    Minutes per session: Not on file  .  Stress: Not on file  Relationships  . Social connections:    Talks on phone: Not on file    Gets together: Not on file    Attends religious service: Not on file    Active member of club or organization: Not on file    Attends meetings of clubs or organizations: Not on file    Relationship status: Not on file  Other Topics Concern  . Not on file  Social History Narrative  . Not on file   Family History  Problem Relation Age of Onset  . Emphysema Mother    No Known Allergies Prior to Admission medications   Medication Sig Start Date End Date Taking? Authorizing Provider  Ascorbic Acid (VITAMIN C) 1000 MG tablet Take 1,000 mg by mouth daily.   Yes [provider]  aspirin 325 MG tablet Take 325 mg by mouth daily.    Yes [provider]  cholecalciferol (VITAMIN D) 1000 UNITS tablet Take 1,000 Units by mouth daily.   Yes [provider]  Coenzyme Q10 (COQ10) 100 MG CAPS Take 1 capsule by mouth daily.   Yes [provider]  fish oil-omega-3 fatty acids 1000 MG capsule Take 1 g by mouth daily.    Yes [provider]  lisinopril (  PRINIVIL,ZESTRIL) 5 MG tablet Take 5 mg by mouth daily.    Yes [provider]  Lycopene 5 MG CAPS Take 1 capsule by mouth every morning.   Yes [provider]  metoprolol succinate (TOPROL-XL) 25 MG 24 hr tablet Take 25 mg by mouth daily.   Yes [provider]  omeprazole (PRILOSEC) 40 MG capsule Take 40 mg by mouth daily.   Yes [provider]  Potassium 99 MG TABS Take 1 tablet by mouth daily.   Yes [provider]  rosuvastatin (CRESTOR) 20 MG tablet Take 20 mg by mouth 4 (four) times a week.   Yes [provider]  Selenium 100 MCG CAPS Take 1 capsule by mouth every morning.   Yes [provider]  Tamsulosin HCl (FLOMAX) 0.4 MG CAPS Take 0.4 mg by mouth daily.     Yes [provider]  vitamin E 400 UNIT capsule Take 400 Units by mouth daily.    Yes [provider]  zinc gluconate 50 MG tablet Take 50 mg by mouth daily.   Yes [provider]  NITROSTAT 0.4 MG SL tablet DISSOLVE ONE TABLET UNDER THE TONGUE AS NEEDED FOR CHEST PAIN AS DIRECTED 03/04/13   Jerline Pain, MD     Positive ROS: All other systems have been reviewed and were otherwise negative with the exception of those mentioned in the HPI and as above.  Physical Exam: General: Alert, no acute distress Cardiovascular: No pedal edema Respiratory: No cyanosis, no use of accessory musculature GI: No organomegaly, abdomen is soft and non-tender Skin: No lesions in the area of chief complaint Neurologic: Sensation intact distally Psychiatric: Patient is competent for consent with normal mood and affect Lymphatic: No axillary or cervical lymphadenopathy  MUSCULOSKELETAL: right shoulder AROM 0-160, ER 20, painful arc, crepitance  Assessment: Right shoulder osteoarthritis   Plan: Plan for Procedure(s): RIGHT TOTAL SHOULDER ARTHROPLASTY  The risks benefits and alternatives were discussed with the patient including but not limited to the risks of nonoperative treatment, versus surgical intervention including infection, bleeding, nerve injury,  blood clots, cardiopulmonary complications, morbidity, mortality, among others, and they were willing to proceed.     Preoperative templating of the joint replacement has been completed, documented, and submitted to the Operating Room personnel in order to optimize intra-operative equipment management.  Johnny Bridge, MD Cell [phone]   09/10/2017 9:40 AM

## 2017-09-10 NOTE — Anesthesia Postprocedure Evaluation (Signed)
Anesthesia Post Note  Patient: Gary Armstrong  Procedure(s) Performed: RIGHT TOTAL SHOULDER ARTHROPLASTY (Right Shoulder)     Patient location during evaluation: PACU Anesthesia Type: General Level of consciousness: awake and alert Pain management: pain level controlled Vital Signs Assessment: post-procedure vital signs reviewed and stable Respiratory status: spontaneous breathing, nonlabored ventilation, respiratory function stable and patient connected to nasal cannula oxygen Cardiovascular status: blood pressure returned to baseline and stable Postop Assessment: no apparent nausea or vomiting Anesthetic complications: no    Last Vitals:  Vitals:   09/10/17 1347 09/10/17 1546  BP: 120/63 120/60  Pulse: (!) 48 63  Resp: 17 16  Temp:  36.4 C  SpO2: 100% 98%    Last Pain:  Vitals:   09/10/17 1546  TempSrc: Oral  PainSc:                  Jakala Herford DAVID

## 2017-09-11 ENCOUNTER — Encounter (HOSPITAL_COMMUNITY): Payer: Self-pay | Admitting: Orthopedic Surgery

## 2017-09-11 LAB — BASIC METABOLIC PANEL
ANION GAP: 11 (ref 5–15)
BUN: 19 mg/dL (ref 8–23)
CHLORIDE: 104 mmol/L (ref 98–111)
CO2: 23 mmol/L (ref 22–32)
Calcium: 8.9 mg/dL (ref 8.9–10.3)
Creatinine, Ser: 0.9 mg/dL (ref 0.61–1.24)
GFR calc Af Amer: >60 mL/min (ref >60)
GFR calc non Af Amer: >60 mL/min (ref >60)
Glucose, Bld: 127 mg/dL — ABNORMAL HIGH (ref 70–99)
POTASSIUM: 3.9 mmol/L (ref 3.5–5.1)
SODIUM: 138 mmol/L (ref 135–145)

## 2017-09-11 LAB — CBC
HCT: 35.3 % — ABNORMAL LOW (ref 39.0–52.0)
HEMOGLOBIN: 12.7 g/dL — AB (ref 13.0–17.0)
MCH: 30.8 pg (ref 26.0–34.0)
MCHC: 36 g/dL (ref 30.0–36.0)
MCV: 85.7 fL (ref 78.0–100.0)
Platelets: 142 10*3/uL — ABNORMAL LOW (ref 150–400)
RBC: 4.12 MIL/uL — AB (ref 4.22–5.81)
RDW: 12.9 % (ref 11.5–15.5)
WBC: 12 10*3/uL — ABNORMAL HIGH (ref 4.0–10.5)

## 2017-09-11 NOTE — Progress Notes (Signed)
Patient alert and oriented, mae's well, voiding adequate amount of urine, swallowing without difficulty, no c/o pain at time of discharge. Patient discharged home with family. Script and discharged instructions given to patient. Patient and family stated understanding of instructions given. Patient has an appointment with Dr. Landau     

## 2017-09-11 NOTE — Discharge Summary (Signed)
Physician Discharge Summary  Patient ID: Gary Armstrong MRN: [id number] DOB/AGE: 1929/04/04 84 y.o.  Admit date: 09/10/2017 Discharge date: 09/11/2017  Admission Diagnoses:  Primary localized osteoarthrosis of right shoulder  Discharge Diagnoses:  Principal Problem:   Primary localized osteoarthrosis of right shoulder Active Problems:   Primary localized osteoarthrosis of shoulder   Past Medical History:  Diagnosis Date  . Arthritis   . Cancer Gary Armstrong)    prostate  . Complication of anesthesia    slow to wake up  . Coronary artery disease   . Heart disease   . History of prostate cancer 06/2009  . Hyperlipidemia   . Hypertension   . Primary localized osteoarthrosis of right shoulder 09/10/2017  . Wears glasses     Surgeries: Procedure(s): RIGHT TOTAL SHOULDER ARTHROPLASTY on 09/10/2017   Consultants (if any):   Discharged Condition: Improved  Hospital Course: Gary Armstrong is an 84 y.o. male who was admitted 09/10/2017 with a diagnosis of Primary localized osteoarthrosis of right shoulder and went to the operating room on 09/10/2017 and underwent the above named procedures.    He was given perioperative antibiotics:  Anti-infectives (From admission, onward)   Start     Dose/Rate Route Frequency Ordered Stop   09/10/17 1700  ceFAZolin (ANCEF) IVPB 1 g/50 mL premix     1 g 100 mL/hr over 30 Minutes Intravenous Every 6 hours 09/10/17 1339 09/11/17 0546   09/10/17 1000  ceFAZolin (ANCEF) IVPB 2g/100 mL premix     2 g 200 mL/hr over 30 Minutes Intravenous To ShortStay Surgical 09/09/17 0918 09/10/17 1052    .  He was given sequential compression devices, early ambulation for DVT prophylaxis.  His Exparel block is still working on the morning of August 7, sensation and motor intact at the hand but still with numbness and absent deltoid function.  He benefited maximally from the hospital stay and there were no complications.    Recent vital signs:  Vitals:   09/11/17 0345 09/11/17  0713  BP: 120/72 114/82  Pulse: (!) 52 (!) 51  Resp: 18 16  Temp: 98 F (36.7 C) 98 F (36.7 C)  SpO2: 94% 93%    Recent laboratory studies:  Lab Results  Component Value Date   HGB 12.7 (L) 09/11/2017   HGB 15.2 08/29/2017   HGB 14.9 03/19/2011   Lab Results  Component Value Date   WBC 12.0 (H) 09/11/2017   PLT 142 (L) 09/11/2017   Lab Results  Component Value Date   INR 1.10 07/01/2009   Lab Results  Component Value Date   NA 138 09/11/2017   K 3.9 09/11/2017   CL 104 09/11/2017   CO2 23 09/11/2017   BUN 19 09/11/2017   CREATININE 0.90 09/11/2017   GLUCOSE 127 (H) 09/11/2017    Discharge Medications:   Allergies as of 09/11/2017   No Known Allergies     Medication List    TAKE these medications   aspirin 325 MG tablet Take 325 mg by mouth daily.   cholecalciferol 1000 units tablet Commonly known as:  VITAMIN D Take 1,000 Units by mouth daily.   CoQ10 100 MG Caps Take 1 capsule by mouth daily.   fish oil-omega-3 fatty acids 1000 MG capsule Take 1 g by mouth daily.   FLOMAX 0.4 MG Caps capsule Generic drug:  tamsulosin Take 0.4 mg by mouth daily.   lisinopril 5 MG tablet Commonly known as:  PRINIVIL,ZESTRIL Take 5 mg by mouth daily.  Lycopene 5 MG Caps Take 1 capsule by mouth every morning.   metoprolol succinate 25 MG 24 hr tablet Commonly known as:  TOPROL-XL Take 25 mg by mouth daily.   NITROSTAT 0.4 MG SL tablet Generic drug:  nitroGLYCERIN DISSOLVE ONE TABLET UNDER THE TONGUE AS NEEDED FOR CHEST PAIN AS DIRECTED   omeprazole 40 MG capsule Commonly known as:  PRILOSEC Take 40 mg by mouth daily.   Potassium 99 MG Tabs Take 1 tablet by mouth daily.   rosuvastatin 20 MG tablet Commonly known as:  CRESTOR Take 20 mg by mouth 4 (four) times a week.   Selenium 100 MCG Caps Take 1 capsule by mouth every morning.   traMADol 50 MG tablet Commonly known as:  ULTRAM Take 1 tablet (50 mg total) by mouth every 6 (six) hours as  needed.   vitamin C 1000 MG tablet Take 1,000 mg by mouth daily.   vitamin E 400 UNIT capsule Take 400 Units by mouth daily.   zinc gluconate 50 MG tablet Take 50 mg by mouth daily.       Diagnostic Studies: Dg Shoulder Right Port  Result Date: 09/10/2017 CLINICAL DATA:  Status post right shoulder joint replacement for osteoarthritis. EXAM: PORTABLE RIGHT SHOULDER COMPARISON:  Right shoulder CT scan of August 07, 2017 FINDINGS: The patient has undergone placement of a prosthetic right shoulder joint. Radiographic positioning of the prosthetic components is good. The interface with the native bone appears normal. IMPRESSION: There is no immediate postprocedure complication following right shoulder joint replacement. Electronically Signed   By: Gary  Armstrong M.D.   On: 09/10/2017 13:14    Disposition:     Follow-up Information    Gary Bond, MD. Schedule an appointment as soon as possible for a visit in 2 weeks.   Specialty:  Orthopedic Surgery Contact information: [address] [place] [address] [phone]            Signed: Johnny Armstrong 09/11/2017, 8:44 AM

## 2017-09-11 NOTE — Evaluation (Signed)
Occupational Therapy Evaluation and Discharge Patient Details Name: Gary Armstrong MRN: [id number] DOB: Oct 03, 1929 Today's Date: 09/11/2017    History of Present Illness Right total Shoulder Arthroplasty   Clinical Impression   This 84 yo male admitted and underwent above presents to acute OT with all education completed with pt and wife as well as post op shoulder handout provided and educated on. No further acute OT needs, we will sign off.     Follow Up Recommendations  No OT follow up    Equipment Recommendations  None recommended by OT       Precautions / Restrictions Precautions Precautions: Shoulder Shoulder Interventions: Off for dressing/bathing/exercises Precaution Booklet Issued: Yes (comment) Precaution Comments: elbow, wrist, hand exercises--YES; shoulder movement--NO Required Braces or Orthoses: Sling Restrictions Weight Bearing Restrictions: Yes RUE Weight Bearing: Non weight bearing      Mobility Bed Mobility Overal bed mobility: Independent                Transfers Overall transfer level: Independent                        ADL either performed or assessed with clinical judgement   ADL                                         General ADL Comments: We discussed how to keep shoulder bandage dry when showering     Vision Patient Visual Report: No change from baseline              Pertinent Vitals/Pain Pain Assessment: No/denies pain(block not totally worn off)     Hand Dominance Right   Extremity/Trunk Assessment Upper Extremity Assessment Upper Extremity Assessment: RUE deficits/detail RUE Deficits / Details: shoulder sx this admission, block not totally worn off (cannot actively flex/ext elbow or supinate forearm; PROM for these WNL RUE Coordination: decreased gross motor           Communication Communication Communication: No difficulties   Cognition Arousal/Alertness: Awake/alert Behavior During  Therapy: WFL for tasks assessed/performed Overall Cognitive Status: Within Functional Limits for tasks assessed                                           Shoulder Instructions Shoulder Instructions Donning/doffing shirt without moving shoulder: Caregiver independent with task Method for sponge bathing under operated UE: Caregiver independent with task Donning/doffing sling/immobilizer: Caregiver independent with task Correct positioning of sling/immobilizer: Caregiver independent with task Pendulum exercises (written home exercise program): (NA) ROM for elbow, wrist and digits of operated UE: Caregiver independent with task Sling wearing schedule (on at all times/off for ADL's): Independent Proper positioning of operated UE when showering: Caregiver independent with task Dressing change: (NA) Positioning of UE while sleeping: Caregiver independent with task    Home Living Family/patient expects to be discharged to:: Private residence Living Arrangements: Spouse/significant other Available Help at Discharge: Family;Available 24 hours/day Type of Home: House                                  Prior Functioning/Environment Level of Independence: Independent                 OT Problem  List: Decreased strength;Decreased range of motion;Impaired UE functional use         OT Goals(Current goals can be found in the care plan section) Acute Rehab OT Goals Patient Stated Goal: home today  OT Frequency:                AM-PAC PT "6 Clicks" Daily Activity     Outcome Measure Help from another person eating meals?: None Help from another person taking care of personal grooming?: A Little Help from another person toileting, which includes using toliet, bedpan, or urinal?: A Little Help from another person bathing (including washing, rinsing, drying)?: A Lot Help from another person to put on and taking off regular upper body clothing?: A Lot Help  from another person to put on and taking off regular lower body clothing?: Total 6 Click Score: 15   End of Session Equipment Utilized During Treatment: Other (comment)(shoulder sling immobilizer) Nurse Communication: (Pt ready to go from OT standpoint)  Activity Tolerance: Patient tolerated treatment well Patient left: (up in room)                   Time: 4287-6811 OT Time Calculation (min): 46 min Charges:  OT General Charges $OT Visit: 1 Visit OT Evaluation $OT Eval Moderate Complexity: 1 Mod OT Treatments $Self Care/Home Management : 8-22 mins $Therapeutic Exercise: 8-22 mins  [address], OTR/L [phone] 09/11/2017

## 2017-09-16 ENCOUNTER — Ambulatory Visit: Payer: Medicare Other | Admitting: Cardiology

## 2017-09-23 DIAGNOSIS — M19011 Primary osteoarthritis, right shoulder: Secondary | ICD-10-CM | POA: Diagnosis not present

## 2017-10-08 DIAGNOSIS — M25511 Pain in right shoulder: Secondary | ICD-10-CM | POA: Diagnosis not present

## 2017-10-21 DIAGNOSIS — I10 Essential (primary) hypertension: Secondary | ICD-10-CM | POA: Diagnosis not present

## 2017-10-21 DIAGNOSIS — I2510 Atherosclerotic heart disease of native coronary artery without angina pectoris: Secondary | ICD-10-CM | POA: Diagnosis not present

## 2017-10-21 DIAGNOSIS — R011 Cardiac murmur, unspecified: Secondary | ICD-10-CM | POA: Diagnosis not present

## 2017-10-21 DIAGNOSIS — Z0001 Encounter for general adult medical examination with abnormal findings: Secondary | ICD-10-CM | POA: Diagnosis not present

## 2017-10-21 DIAGNOSIS — Z79899 Other long term (current) drug therapy: Secondary | ICD-10-CM | POA: Diagnosis not present

## 2017-10-21 DIAGNOSIS — K219 Gastro-esophageal reflux disease without esophagitis: Secondary | ICD-10-CM | POA: Diagnosis not present

## 2017-10-21 DIAGNOSIS — M19011 Primary osteoarthritis, right shoulder: Secondary | ICD-10-CM | POA: Diagnosis not present

## 2017-10-21 DIAGNOSIS — Z23 Encounter for immunization: Secondary | ICD-10-CM | POA: Diagnosis not present

## 2017-10-21 DIAGNOSIS — R159 Full incontinence of feces: Secondary | ICD-10-CM | POA: Diagnosis not present

## 2017-10-21 DIAGNOSIS — E7800 Pure hypercholesterolemia, unspecified: Secondary | ICD-10-CM | POA: Diagnosis not present

## 2017-10-21 DIAGNOSIS — J385 Laryngeal spasm: Secondary | ICD-10-CM | POA: Diagnosis not present

## 2017-10-21 DIAGNOSIS — C61 Malignant neoplasm of prostate: Secondary | ICD-10-CM | POA: Diagnosis not present

## 2017-10-21 DIAGNOSIS — M189 Osteoarthritis of first carpometacarpal joint, unspecified: Secondary | ICD-10-CM | POA: Diagnosis not present

## 2017-10-23 DIAGNOSIS — M25511 Pain in right shoulder: Secondary | ICD-10-CM | POA: Diagnosis not present

## 2017-10-31 DIAGNOSIS — Z96611 Presence of right artificial shoulder joint: Secondary | ICD-10-CM | POA: Diagnosis not present

## 2017-10-31 DIAGNOSIS — R531 Weakness: Secondary | ICD-10-CM | POA: Diagnosis not present

## 2017-10-31 DIAGNOSIS — M25511 Pain in right shoulder: Secondary | ICD-10-CM | POA: Diagnosis not present

## 2017-10-31 DIAGNOSIS — M25611 Stiffness of right shoulder, not elsewhere classified: Secondary | ICD-10-CM | POA: Diagnosis not present

## 2017-11-05 DIAGNOSIS — M25611 Stiffness of right shoulder, not elsewhere classified: Secondary | ICD-10-CM | POA: Diagnosis not present

## 2017-11-05 DIAGNOSIS — M25511 Pain in right shoulder: Secondary | ICD-10-CM | POA: Diagnosis not present

## 2017-11-05 DIAGNOSIS — R531 Weakness: Secondary | ICD-10-CM | POA: Diagnosis not present

## 2017-11-05 DIAGNOSIS — Z96611 Presence of right artificial shoulder joint: Secondary | ICD-10-CM | POA: Diagnosis not present

## 2017-11-12 DIAGNOSIS — M25611 Stiffness of right shoulder, not elsewhere classified: Secondary | ICD-10-CM | POA: Diagnosis not present

## 2017-11-12 DIAGNOSIS — R531 Weakness: Secondary | ICD-10-CM | POA: Diagnosis not present

## 2017-11-12 DIAGNOSIS — M25511 Pain in right shoulder: Secondary | ICD-10-CM | POA: Diagnosis not present

## 2017-11-12 DIAGNOSIS — Z96611 Presence of right artificial shoulder joint: Secondary | ICD-10-CM | POA: Diagnosis not present

## 2017-11-18 DIAGNOSIS — M19011 Primary osteoarthritis, right shoulder: Secondary | ICD-10-CM | POA: Diagnosis not present

## 2017-11-25 DIAGNOSIS — M25611 Stiffness of right shoulder, not elsewhere classified: Secondary | ICD-10-CM | POA: Diagnosis not present

## 2017-11-25 DIAGNOSIS — M25511 Pain in right shoulder: Secondary | ICD-10-CM | POA: Diagnosis not present

## 2017-11-25 DIAGNOSIS — Z96611 Presence of right artificial shoulder joint: Secondary | ICD-10-CM | POA: Diagnosis not present

## 2017-11-25 DIAGNOSIS — R531 Weakness: Secondary | ICD-10-CM | POA: Diagnosis not present

## 2017-12-02 DIAGNOSIS — M1711 Unilateral primary osteoarthritis, right knee: Secondary | ICD-10-CM | POA: Diagnosis not present

## 2017-12-12 DIAGNOSIS — M25511 Pain in right shoulder: Secondary | ICD-10-CM | POA: Diagnosis not present

## 2017-12-25 DIAGNOSIS — H04221 Epiphora due to insufficient drainage, right lacrimal gland: Secondary | ICD-10-CM | POA: Diagnosis not present

## 2018-02-24 DIAGNOSIS — M1711 Unilateral primary osteoarthritis, right knee: Secondary | ICD-10-CM | POA: Diagnosis not present

## 2018-09-18 DIAGNOSIS — M1710 Unilateral primary osteoarthritis, unspecified knee: Secondary | ICD-10-CM | POA: Diagnosis not present

## 2018-09-23 DIAGNOSIS — M1711 Unilateral primary osteoarthritis, right knee: Secondary | ICD-10-CM | POA: Diagnosis not present

## 2018-10-17 DIAGNOSIS — Z23 Encounter for immunization: Secondary | ICD-10-CM | POA: Diagnosis not present

## 2018-11-03 DIAGNOSIS — M25561 Pain in right knee: Secondary | ICD-10-CM | POA: Diagnosis not present

## 2018-11-03 DIAGNOSIS — I10 Essential (primary) hypertension: Secondary | ICD-10-CM | POA: Diagnosis not present

## 2018-11-03 DIAGNOSIS — Z8546 Personal history of malignant neoplasm of prostate: Secondary | ICD-10-CM | POA: Diagnosis not present

## 2018-11-03 DIAGNOSIS — I2510 Atherosclerotic heart disease of native coronary artery without angina pectoris: Secondary | ICD-10-CM | POA: Diagnosis not present

## 2018-11-03 DIAGNOSIS — Z01818 Encounter for other preprocedural examination: Secondary | ICD-10-CM | POA: Diagnosis not present

## 2018-11-03 DIAGNOSIS — E785 Hyperlipidemia, unspecified: Secondary | ICD-10-CM | POA: Diagnosis not present

## 2018-11-03 DIAGNOSIS — M1990 Unspecified osteoarthritis, unspecified site: Secondary | ICD-10-CM | POA: Diagnosis not present

## 2018-11-19 DIAGNOSIS — E785 Hyperlipidemia, unspecified: Secondary | ICD-10-CM | POA: Diagnosis not present

## 2018-11-19 DIAGNOSIS — I2510 Atherosclerotic heart disease of native coronary artery without angina pectoris: Secondary | ICD-10-CM | POA: Diagnosis not present

## 2018-11-19 DIAGNOSIS — K219 Gastro-esophageal reflux disease without esophagitis: Secondary | ICD-10-CM | POA: Diagnosis not present

## 2018-11-19 DIAGNOSIS — Z20828 Contact with and (suspected) exposure to other viral communicable diseases: Secondary | ICD-10-CM | POA: Diagnosis not present

## 2018-11-19 DIAGNOSIS — I10 Essential (primary) hypertension: Secondary | ICD-10-CM | POA: Diagnosis not present

## 2018-11-19 DIAGNOSIS — M1711 Unilateral primary osteoarthritis, right knee: Secondary | ICD-10-CM | POA: Diagnosis not present

## 2018-11-19 DIAGNOSIS — Z9861 Coronary angioplasty status: Secondary | ICD-10-CM | POA: Diagnosis not present

## 2018-11-21 DIAGNOSIS — Z20828 Contact with and (suspected) exposure to other viral communicable diseases: Secondary | ICD-10-CM | POA: Diagnosis not present

## 2018-11-22 DIAGNOSIS — I10 Essential (primary) hypertension: Secondary | ICD-10-CM | POA: Diagnosis not present

## 2018-11-22 DIAGNOSIS — I2510 Atherosclerotic heart disease of native coronary artery without angina pectoris: Secondary | ICD-10-CM | POA: Diagnosis not present

## 2018-11-22 DIAGNOSIS — K219 Gastro-esophageal reflux disease without esophagitis: Secondary | ICD-10-CM | POA: Diagnosis not present

## 2018-11-22 DIAGNOSIS — M1711 Unilateral primary osteoarthritis, right knee: Secondary | ICD-10-CM | POA: Diagnosis not present

## 2018-11-22 DIAGNOSIS — Z20828 Contact with and (suspected) exposure to other viral communicable diseases: Secondary | ICD-10-CM | POA: Diagnosis not present

## 2018-11-22 DIAGNOSIS — Z9861 Coronary angioplasty status: Secondary | ICD-10-CM | POA: Diagnosis not present

## 2018-11-24 DIAGNOSIS — I10 Essential (primary) hypertension: Secondary | ICD-10-CM | POA: Diagnosis not present

## 2018-11-24 DIAGNOSIS — M1711 Unilateral primary osteoarthritis, right knee: Secondary | ICD-10-CM | POA: Diagnosis not present

## 2018-11-24 DIAGNOSIS — I2510 Atherosclerotic heart disease of native coronary artery without angina pectoris: Secondary | ICD-10-CM | POA: Diagnosis not present

## 2018-11-24 DIAGNOSIS — M25561 Pain in right knee: Secondary | ICD-10-CM | POA: Diagnosis not present

## 2018-11-24 DIAGNOSIS — Z96651 Presence of right artificial knee joint: Secondary | ICD-10-CM | POA: Diagnosis not present

## 2018-11-24 DIAGNOSIS — K219 Gastro-esophageal reflux disease without esophagitis: Secondary | ICD-10-CM | POA: Diagnosis not present

## 2018-11-24 DIAGNOSIS — Z9861 Coronary angioplasty status: Secondary | ICD-10-CM | POA: Diagnosis not present

## 2018-11-24 DIAGNOSIS — G8918 Other acute postprocedural pain: Secondary | ICD-10-CM | POA: Diagnosis not present

## 2018-11-24 DIAGNOSIS — Z20828 Contact with and (suspected) exposure to other viral communicable diseases: Secondary | ICD-10-CM | POA: Diagnosis not present

## 2018-12-01 DIAGNOSIS — M25661 Stiffness of right knee, not elsewhere classified: Secondary | ICD-10-CM | POA: Diagnosis not present

## 2018-12-01 DIAGNOSIS — Z96651 Presence of right artificial knee joint: Secondary | ICD-10-CM | POA: Diagnosis not present

## 2018-12-01 DIAGNOSIS — Z471 Aftercare following joint replacement surgery: Secondary | ICD-10-CM | POA: Diagnosis not present

## 2018-12-03 DIAGNOSIS — Z96651 Presence of right artificial knee joint: Secondary | ICD-10-CM | POA: Diagnosis not present

## 2018-12-03 DIAGNOSIS — M25661 Stiffness of right knee, not elsewhere classified: Secondary | ICD-10-CM | POA: Diagnosis not present

## 2018-12-03 DIAGNOSIS — Z471 Aftercare following joint replacement surgery: Secondary | ICD-10-CM | POA: Diagnosis not present

## 2018-12-05 DIAGNOSIS — Z471 Aftercare following joint replacement surgery: Secondary | ICD-10-CM | POA: Diagnosis not present

## 2018-12-05 DIAGNOSIS — M25661 Stiffness of right knee, not elsewhere classified: Secondary | ICD-10-CM | POA: Diagnosis not present

## 2018-12-05 DIAGNOSIS — Z96651 Presence of right artificial knee joint: Secondary | ICD-10-CM | POA: Diagnosis not present

## 2018-12-08 DIAGNOSIS — M25661 Stiffness of right knee, not elsewhere classified: Secondary | ICD-10-CM | POA: Diagnosis not present

## 2018-12-08 DIAGNOSIS — Z471 Aftercare following joint replacement surgery: Secondary | ICD-10-CM | POA: Diagnosis not present

## 2018-12-08 DIAGNOSIS — Z96651 Presence of right artificial knee joint: Secondary | ICD-10-CM | POA: Diagnosis not present

## 2018-12-10 DIAGNOSIS — Z96651 Presence of right artificial knee joint: Secondary | ICD-10-CM | POA: Diagnosis not present

## 2018-12-10 DIAGNOSIS — M25661 Stiffness of right knee, not elsewhere classified: Secondary | ICD-10-CM | POA: Diagnosis not present

## 2018-12-10 DIAGNOSIS — Z471 Aftercare following joint replacement surgery: Secondary | ICD-10-CM | POA: Diagnosis not present

## 2018-12-12 DIAGNOSIS — M25661 Stiffness of right knee, not elsewhere classified: Secondary | ICD-10-CM | POA: Diagnosis not present

## 2018-12-12 DIAGNOSIS — Z96651 Presence of right artificial knee joint: Secondary | ICD-10-CM | POA: Diagnosis not present

## 2018-12-12 DIAGNOSIS — Z471 Aftercare following joint replacement surgery: Secondary | ICD-10-CM | POA: Diagnosis not present

## 2018-12-15 DIAGNOSIS — M25661 Stiffness of right knee, not elsewhere classified: Secondary | ICD-10-CM | POA: Diagnosis not present

## 2018-12-15 DIAGNOSIS — Z96651 Presence of right artificial knee joint: Secondary | ICD-10-CM | POA: Diagnosis not present

## 2018-12-15 DIAGNOSIS — Z471 Aftercare following joint replacement surgery: Secondary | ICD-10-CM | POA: Diagnosis not present

## 2018-12-17 DIAGNOSIS — M25661 Stiffness of right knee, not elsewhere classified: Secondary | ICD-10-CM | POA: Diagnosis not present

## 2018-12-17 DIAGNOSIS — Z96651 Presence of right artificial knee joint: Secondary | ICD-10-CM | POA: Diagnosis not present

## 2018-12-17 DIAGNOSIS — Z471 Aftercare following joint replacement surgery: Secondary | ICD-10-CM | POA: Diagnosis not present

## 2018-12-19 DIAGNOSIS — M25661 Stiffness of right knee, not elsewhere classified: Secondary | ICD-10-CM | POA: Diagnosis not present

## 2018-12-19 DIAGNOSIS — Z471 Aftercare following joint replacement surgery: Secondary | ICD-10-CM | POA: Diagnosis not present

## 2018-12-19 DIAGNOSIS — Z96651 Presence of right artificial knee joint: Secondary | ICD-10-CM | POA: Diagnosis not present

## 2018-12-24 DIAGNOSIS — Z96651 Presence of right artificial knee joint: Secondary | ICD-10-CM | POA: Diagnosis not present

## 2018-12-24 DIAGNOSIS — M25661 Stiffness of right knee, not elsewhere classified: Secondary | ICD-10-CM | POA: Diagnosis not present

## 2018-12-24 DIAGNOSIS — Z471 Aftercare following joint replacement surgery: Secondary | ICD-10-CM | POA: Diagnosis not present

## 2018-12-26 DIAGNOSIS — M25661 Stiffness of right knee, not elsewhere classified: Secondary | ICD-10-CM | POA: Diagnosis not present

## 2018-12-26 DIAGNOSIS — Z471 Aftercare following joint replacement surgery: Secondary | ICD-10-CM | POA: Diagnosis not present

## 2018-12-26 DIAGNOSIS — Z96651 Presence of right artificial knee joint: Secondary | ICD-10-CM | POA: Diagnosis not present

## 2019-02-28 IMAGING — CT CT SHOULDER*R* W/O CM
1 series · 12 of 14 positions shown, 15 images · non-contrast
Comparison: None.

CLINICAL DATA: Right shoulder pain.  Preoperative planning.

EXAM:
CT OF THE UPPER RIGHT EXTREMITY WITHOUT CONTRAST
TECHNIQUE: Multidetector CT imaging of the upper right extremity was performed
according to the standard protocol.

[Series 3: soft tissue · axial · 0.49mm/px · z∈[-254,-76]mm · 12 of 107 slices shown, 15 images]
[im 9/107  soft-tissue]
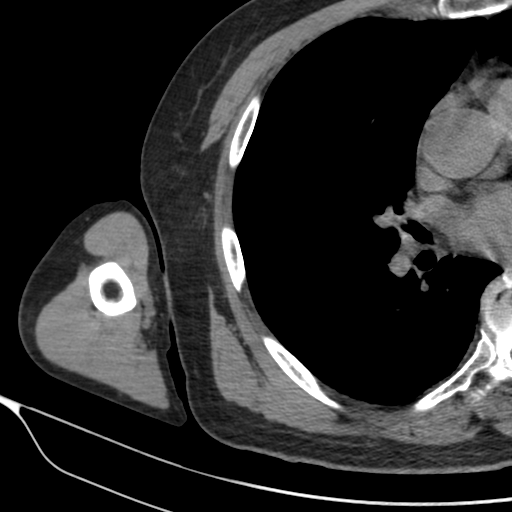
[im 9/107  bone]
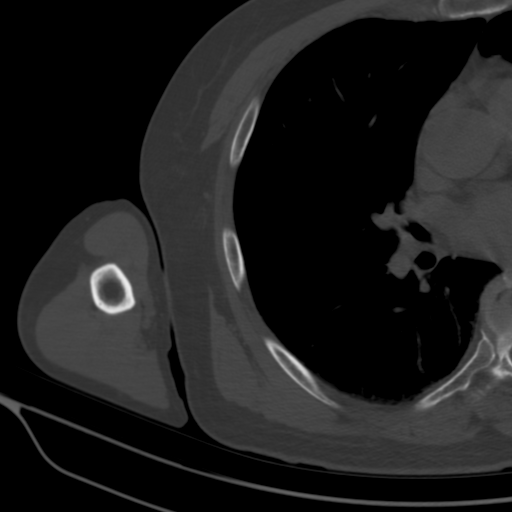
[im 17/107  bone]
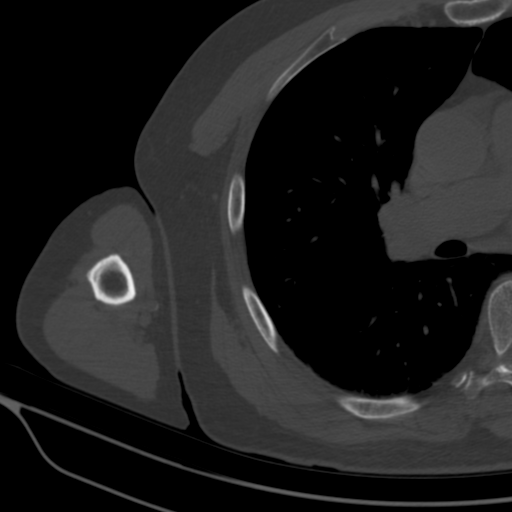
[im 25/107  bone]
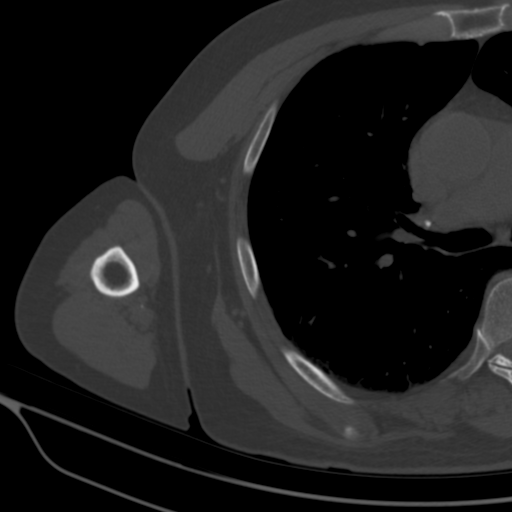
[im 33/107  bone]
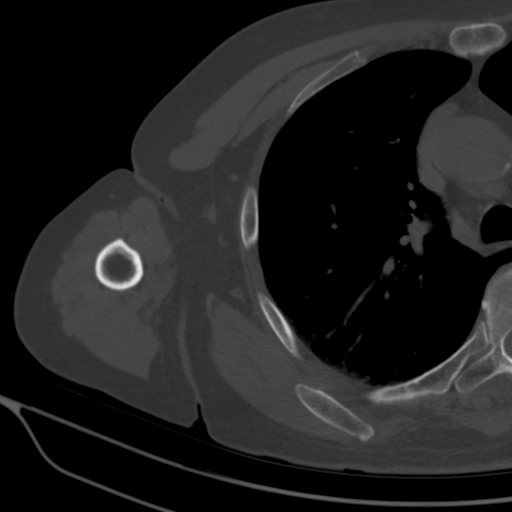
[im 41/107  soft-tissue]
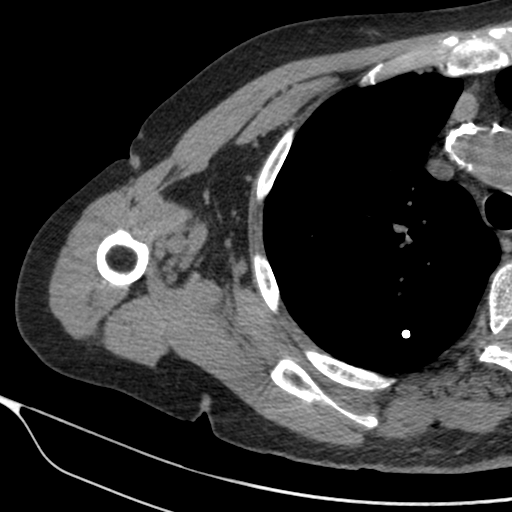
[im 41/107  bone]
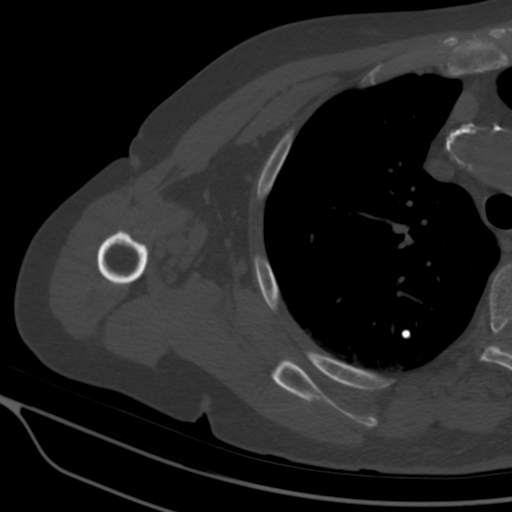
[im 49/107  bone]
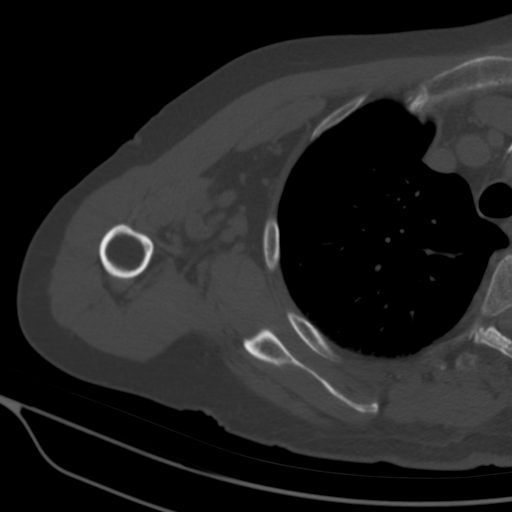
[im 58/107  bone]
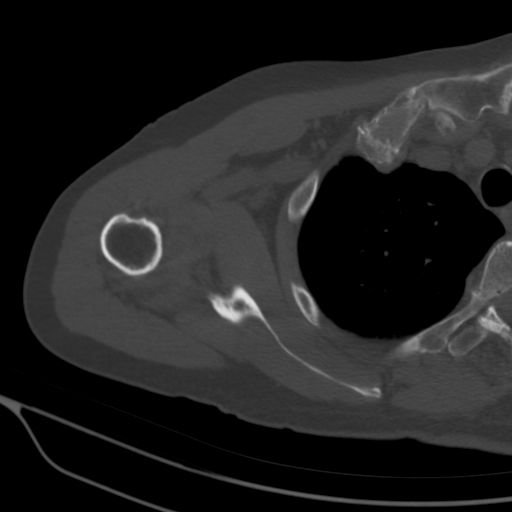
[im 66/107  bone]
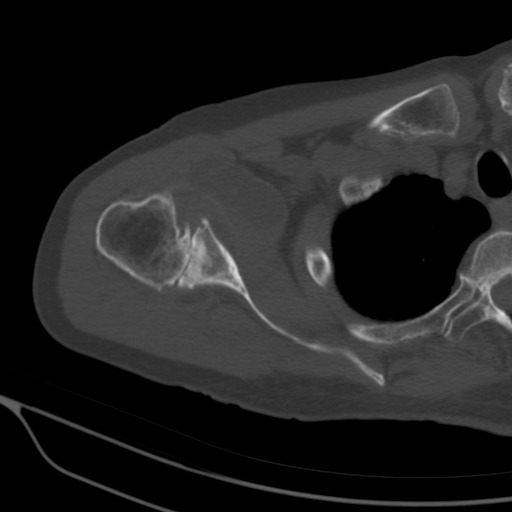
[im 74/107  soft-tissue]
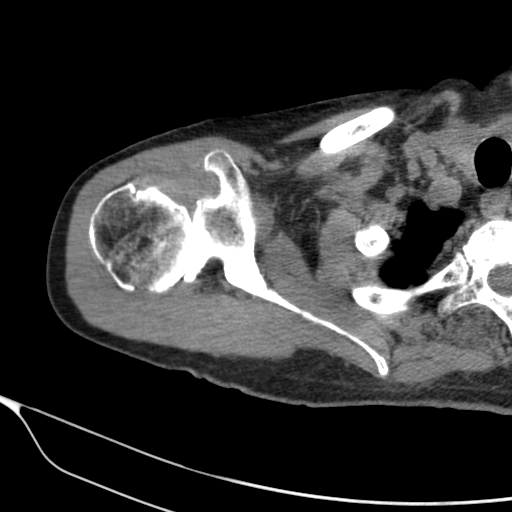
[im 74/107  bone]
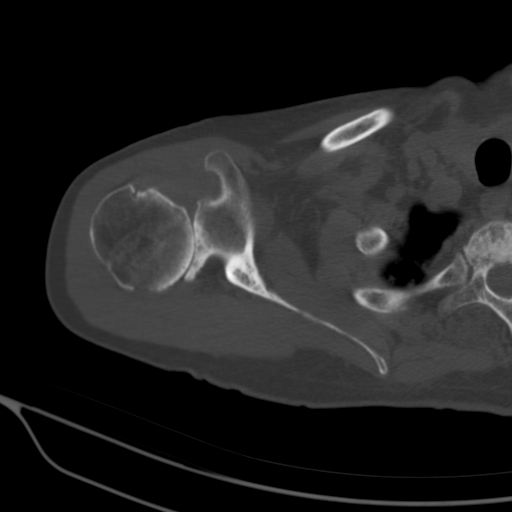
[im 82/107  bone]
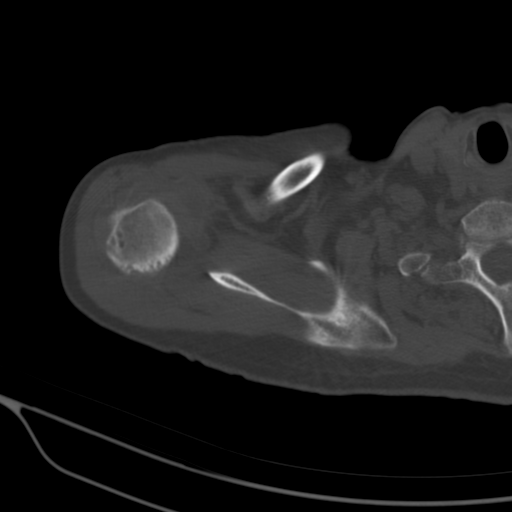
[im 90/107  bone]
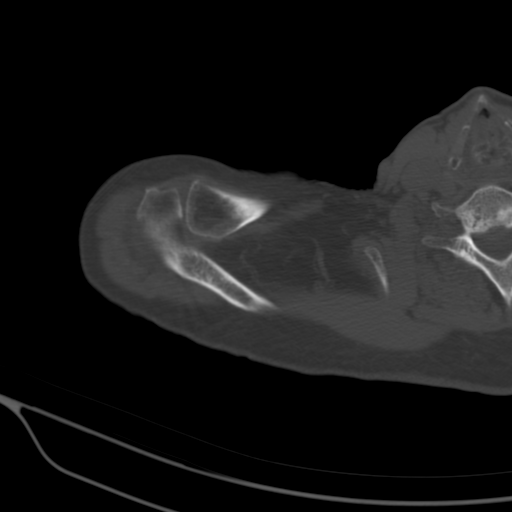
[im 98/107  bone]
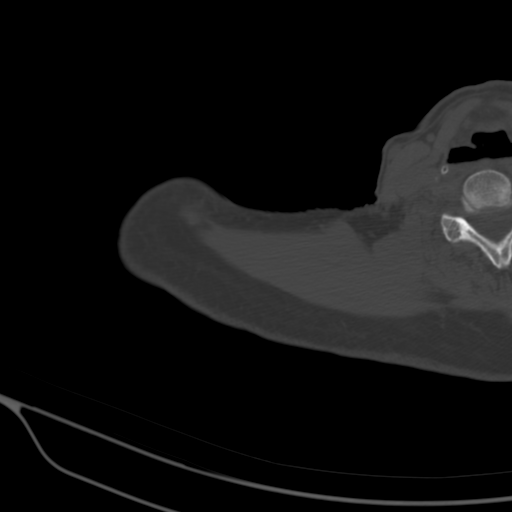

[12 of 14 positions shown; findings below may reference images not displayed]

FINDINGS: Bones/Joint/Cartilage

There is severe osteoarthritis of the glenohumeral joint with marked
narrowing of the joint space with sclerosis, marginal osteophyte
formation, and subcortical cyst formation in the glenoid and humeral
head. There is a joint effusion with prominent fluid in the
subcoracoid recess of the joint. There is erosion of the posterior
aspect of the glenoid.

There only slight degenerative changes of the AC joint. Type 1
acromion.

Muscles and Tendons

No significant abnormality. No atrophy of the muscles of the rotator
cuff.

Soft tissues

No adenopathy.
IMPRESSION: Severe arthritic changes of the glenohumeral joint as described.
Prominent joint effusion, with distention of the subcoracoid recess.

## 2019-04-03 IMAGING — DX DG SHOULDER 2+V PORT*R*
1 series · 1 of 1 positions shown · non-contrast
Comparison: Right shoulder CT scan August 07, 2017

CLINICAL DATA: Status post right shoulder joint replacement for
osteoarthritis.

EXAM:
PORTABLE RIGHT SHOULDER

[shoulder ap]
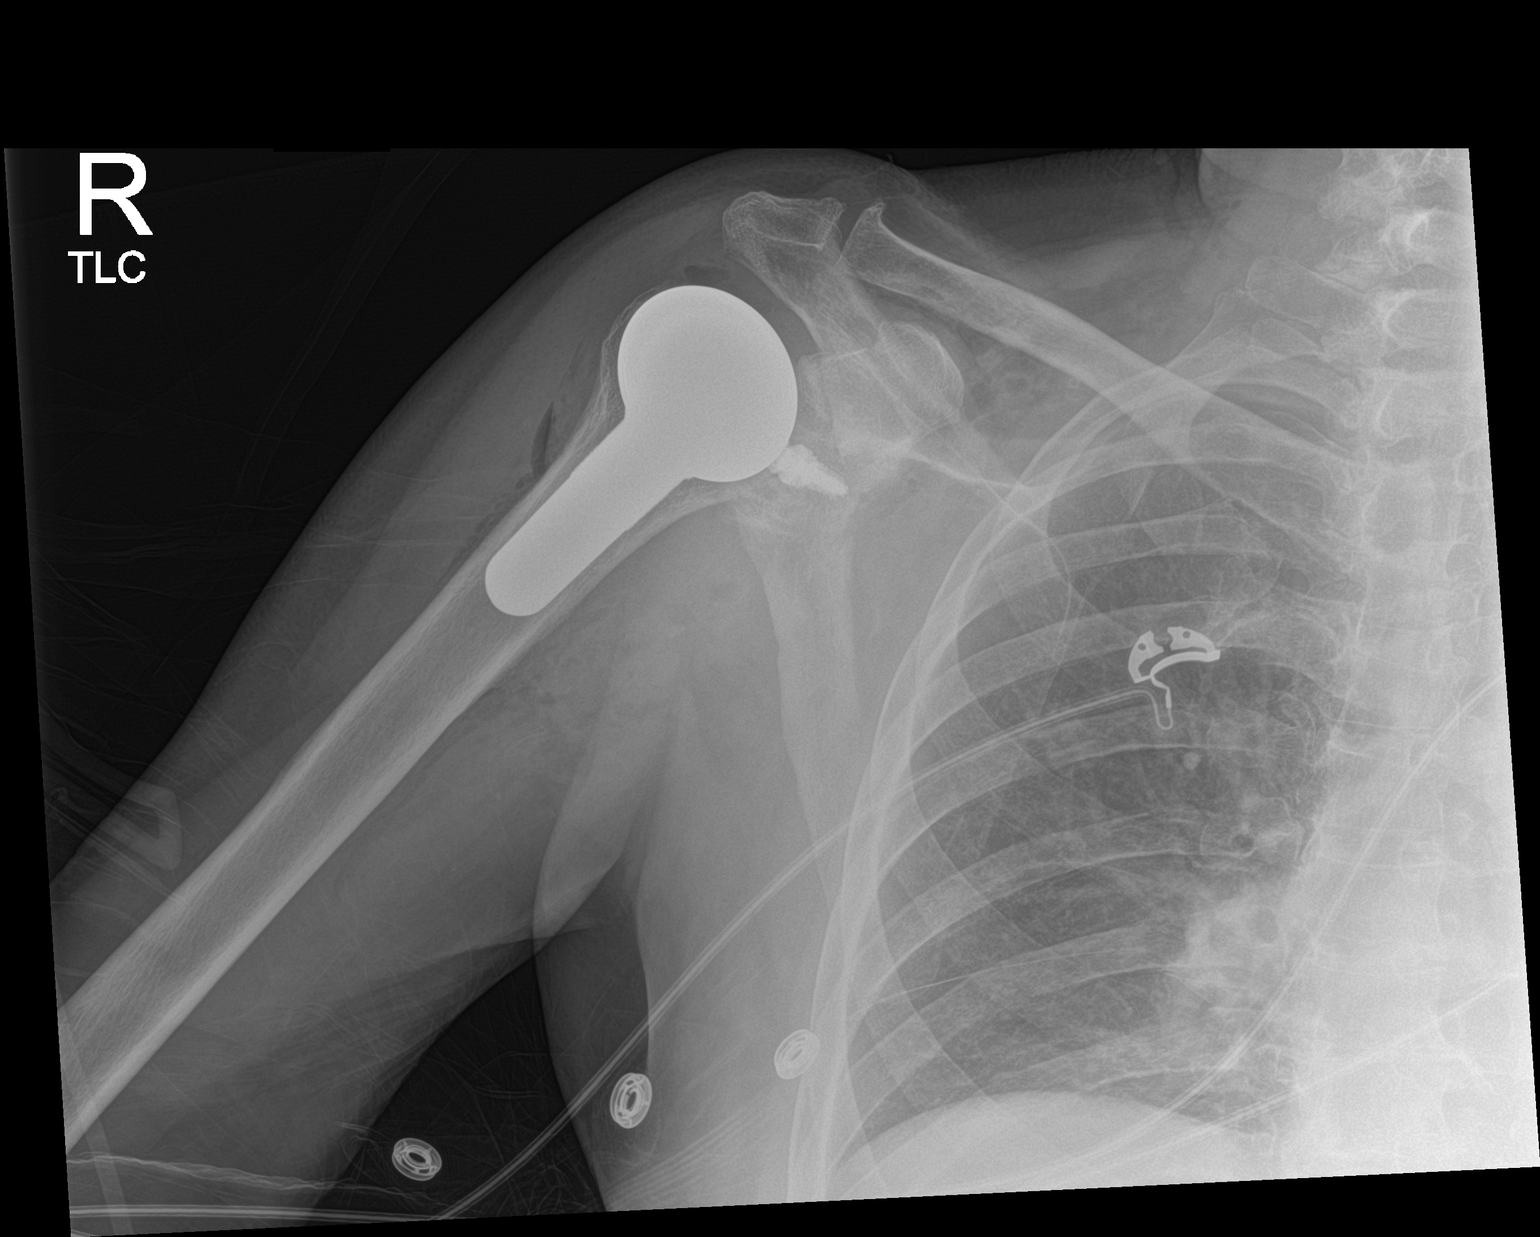

[1 of 1 positions shown; findings below may reference images not displayed]

FINDINGS: The patient has undergone placement of a prosthetic right shoulder
joint. Radiographic positioning of the prosthetic components is
good. The interface with the native bone appears normal.
IMPRESSION: There is no immediate postprocedure complication following right
shoulder joint replacement.

## 2019-12-04 ENCOUNTER — Telehealth: Payer: Self-pay | Admitting: Cardiology

## 2019-12-04 NOTE — Telephone Encounter (Signed)
    Called pt to schedule recall, he said he moved to [place]
